# Patient Record
Sex: Female | Born: 1995 | Race: Black or African American | Hispanic: No | Marital: Single | Smoking: Former smoker
Health system: Southern US, Community
[De-identification: ages and names within clinical notes are randomized; demographics above are authoritative.]

## PROBLEM LIST (undated history)

## (undated) ENCOUNTER — Inpatient Hospital Stay (HOSPITAL_COMMUNITY): Payer: Self-pay

## (undated) DIAGNOSIS — D649 Anemia, unspecified: Secondary | ICD-10-CM

## (undated) DIAGNOSIS — N200 Calculus of kidney: Secondary | ICD-10-CM

## (undated) DIAGNOSIS — N390 Urinary tract infection, site not specified: Secondary | ICD-10-CM

## (undated) DIAGNOSIS — F32A Depression, unspecified: Secondary | ICD-10-CM

## (undated) HISTORY — PX: ANKLE SURGERY: SHX546

## (undated) HISTORY — PX: HEAD & NECK SKIN LESION EXCISIONAL BIOPSY: SUR472

---

## 2018-06-22 ENCOUNTER — Encounter (HOSPITAL_COMMUNITY): Payer: Self-pay | Admitting: Emergency Medicine

## 2018-06-22 ENCOUNTER — Emergency Department (HOSPITAL_COMMUNITY): Payer: Medicaid Other

## 2018-06-22 ENCOUNTER — Other Ambulatory Visit: Payer: Self-pay

## 2018-06-22 ENCOUNTER — Emergency Department (HOSPITAL_COMMUNITY)
Admission: EM | Admit: 2018-06-22 | Discharge: 2018-06-22 | Disposition: A | Discharge status: Home / Self Care | Payer: Medicaid Other | Attending: Emergency Medicine | Admitting: Emergency Medicine

## 2018-06-22 DIAGNOSIS — N12 Tubulo-interstitial nephritis, not specified as acute or chronic: Secondary | ICD-10-CM

## 2018-06-22 DIAGNOSIS — R319 Hematuria, unspecified: Secondary | ICD-10-CM | POA: Insufficient documentation

## 2018-06-22 DIAGNOSIS — Z87442 Personal history of urinary calculi: Secondary | ICD-10-CM | POA: Diagnosis not present

## 2018-06-22 DIAGNOSIS — N10 Acute tubulo-interstitial nephritis: Secondary | ICD-10-CM | POA: Diagnosis not present

## 2018-06-22 DIAGNOSIS — R1031 Right lower quadrant pain: Secondary | ICD-10-CM | POA: Diagnosis present

## 2018-06-22 DIAGNOSIS — N1330 Unspecified hydronephrosis: Secondary | ICD-10-CM | POA: Insufficient documentation

## 2018-06-22 LAB — COMPREHENSIVE METABOLIC PANEL WITH GFR
ALT: 41 U/L (ref 0–44)
AST: 32 U/L (ref 15–41)
Albumin: 3.7 g/dL (ref 3.5–5.0)
Alkaline Phosphatase: 84 U/L (ref 38–126)
Anion gap: 10 (ref 5–15)
BUN: 5 mg/dL — ABNORMAL LOW (ref 6–20)
CO2: 22 mmol/L (ref 22–32)
Calcium: 8.9 mg/dL (ref 8.9–10.3)
Chloride: 106 mmol/L (ref 98–111)
Creatinine, Ser: 0.54 mg/dL (ref 0.44–1.00)
GFR calc Af Amer: >60 mL/min
GFR calc non Af Amer: >60 mL/min
Glucose, Bld: 92 mg/dL (ref 70–99)
Potassium: 3.7 mmol/L (ref 3.5–5.1)
Sodium: 138 mmol/L (ref 135–145)
Total Bilirubin: 0.5 mg/dL (ref 0.3–1.2)
Total Protein: 6.9 g/dL (ref 6.5–8.1)

## 2018-06-22 LAB — CBC WITH DIFFERENTIAL/PLATELET
Abs Immature Granulocytes: 0.01 K/uL (ref 0.00–0.07)
Basophils Absolute: 0 K/uL (ref 0.0–0.1)
Basophils Relative: 1 %
Eosinophils Absolute: 0 K/uL (ref 0.0–0.5)
Eosinophils Relative: 0 %
HCT: 36.6 % (ref 36.0–46.0)
Hemoglobin: 11.1 g/dL — ABNORMAL LOW (ref 12.0–15.0)
Immature Granulocytes: 0 %
Lymphocytes Relative: 30 %
Lymphs Abs: 0.9 K/uL (ref 0.7–4.0)
MCH: 26.4 pg (ref 26.0–34.0)
MCHC: 30.3 g/dL (ref 30.0–36.0)
MCV: 87.1 fL (ref 80.0–100.0)
Monocytes Absolute: 0.7 K/uL (ref 0.1–1.0)
Monocytes Relative: 21 %
Neutro Abs: 1.5 K/uL — ABNORMAL LOW (ref 1.7–7.7)
Neutrophils Relative %: 48 %
Platelets: 231 K/uL (ref 150–400)
RBC: 4.2 MIL/uL (ref 3.87–5.11)
RDW: 16.3 % — ABNORMAL HIGH (ref 11.5–15.5)
WBC: 3.2 K/uL — ABNORMAL LOW (ref 4.0–10.5)
nRBC: 0 % (ref 0.0–0.2)

## 2018-06-22 LAB — URINALYSIS, ROUTINE W REFLEX MICROSCOPIC
Bilirubin Urine: NEGATIVE
Glucose, UA: NEGATIVE mg/dL
KETONES UR: NEGATIVE mg/dL
Leukocytes,Ua: NEGATIVE
Nitrite: NEGATIVE
Protein, ur: NEGATIVE mg/dL
Specific Gravity, Urine: 1.025 (ref 1.005–1.030)
pH: 5 (ref 5.0–8.0)

## 2018-06-22 LAB — LIPASE, BLOOD: Lipase: 21 U/L (ref 11–51)

## 2018-06-22 LAB — I-STAT BETA HCG BLOOD, ED (MC, WL, AP ONLY): I-stat hCG, quantitative: <5 m[IU]/mL

## 2018-06-22 MED ORDER — CEPHALEXIN 500 MG PO CAPS: 1000.0000 mg | ORAL_CAPSULE | Freq: Two times a day (BID) | ORAL | 0 refills | Status: DC

## 2018-06-22 MED ORDER — SODIUM CHLORIDE 0.9 % IV SOLN
1.0000 g | Freq: Once | INTRAVENOUS | Status: AC
  Administered 2018-06-22: 1 g via INTRAVENOUS
  Filled 2018-06-22: qty 10

## 2018-06-22 MED ORDER — ONDANSETRON 4 MG PO TBDP: 4.0000 mg | ORAL_TABLET | ORAL | 0 refills | Status: DC | PRN

## 2018-06-22 MED ORDER — SODIUM CHLORIDE 0.9 % IV BOLUS
1000.0000 mL | Freq: Once | INTRAVENOUS | Status: AC
  Administered 2018-06-22: 1000 mL via INTRAVENOUS

## 2018-06-22 MED ORDER — ONDANSETRON HCL 4 MG/2ML IJ SOLN
4.0000 mg | Freq: Once | INTRAMUSCULAR | Status: AC
  Administered 2018-06-22: 4 mg via INTRAVENOUS
  Filled 2018-06-22: qty 2

## 2018-06-22 MED ORDER — KETOROLAC TROMETHAMINE 30 MG/ML IJ SOLN
30.0000 mg | Freq: Once | INTRAMUSCULAR | Status: AC
  Administered 2018-06-22: 30 mg via INTRAVENOUS
  Filled 2018-06-22: qty 1

## 2018-06-22 MED ORDER — MORPHINE SULFATE (PF) 4 MG/ML IV SOLN
4.0000 mg | Freq: Once | INTRAVENOUS | Status: AC
  Administered 2018-06-22: 4 mg via INTRAVENOUS
  Filled 2018-06-22: qty 1

## 2018-06-22 MED ORDER — HYDROCODONE-ACETAMINOPHEN 5-325 MG PO TABS: 1.0000 | ORAL_TABLET | Freq: Four times a day (QID) | ORAL | 0 refills | Status: DC | PRN

## 2018-06-22 NOTE — ED Triage Notes (Signed)
Pt arrives POV with complaints of right side flank pain that began this morning. Pt states she has a history of kidney stones and this pain feels the same. 10/10 pain

## 2018-06-22 NOTE — ED Provider Notes (Signed)
[facility] EMERGENCY DEPARTMENT Provider Note   CSN: [id number] Arrival date & time: 06/22/18  4098    History   Chief Complaint Chief Complaint  Patient presents with   Flank Pain    HPI Gabrielle Jordan is a 23 y.o. female.     HPI Patient reports prior history of kidney stone approximately 3 years ago.  She did not require any interventional procedures at that time.  She reports she did take medications and an antibiotic.  Patient reports she was treated at [facility] at that time.  She denies she is had any recurrence of kidney stone since then.  She has been careful not to drink any soda for the past 3 years.  She reports she has some very mild pain starting yesterday evening.  She had a normal day yesterday without any symptoms.  She reports this morning at 8 AM she awakened and had severe pain in her right flank that radiates towards her vaginal area.  She denies pain or burning with urination.  She denies vaginal discharge or bleeding.  Patient reports she is sexually active.  She does not use birth control.  She does not suspect risk for STD.  Denies problems with pain with intercourse.  Pain is worse with movements.  No fever, no chills, no cough, no shortness of breath. History reviewed. No pertinent past medical history.  There are no active problems to display for this patient.      OB History   No obstetric history on file.      Home Medications    Prior to Admission medications   Medication Sig Start Date End Date Taking? Authorizing Provider  cephALEXin (KEFLEX) 500 MG capsule Take 2 capsules (1,000 mg total) by mouth 2 (two) times daily. 06/22/18   Arby Barrette, MD  HYDROcodone-acetaminophen (NORCO/VICODIN) 5-325 MG tablet Take 1-2 tablets by mouth every 6 (six) hours as needed for moderate pain or severe pain. 06/22/18   Arby Barrette, MD    Family History No family history on file.  Social History Social History   Tobacco  Use   Smoking status: Not on file  Substance Use Topics   Alcohol use: Yes   Drug use: Never     Allergies   Patient has no allergy information on record.   Review of Systems Review of Systems 10 Systems reviewed and are negative for acute change except as noted in the HPI.   Physical Exam Updated Vital Signs BP (!) 97/54    Pulse 68    Temp 98.6 F (37 C) (Oral)    Resp 13    Ht  (1.422 m)    Wt 72.6 kg    SpO2 99%    BMI 35.87 kg/m   Physical Exam Constitutional:      Appearance: She is well-developed.  HENT:     Head: Normocephalic and atraumatic.  Eyes:     Extraocular Movements: Extraocular movements intact.  Neck:     Musculoskeletal: Neck supple.  Cardiovascular:     Rate and Rhythm: Normal rate and regular rhythm.     Heart sounds: Normal heart sounds.  Pulmonary:     Effort: Pulmonary effort is normal.     Breath sounds: Normal breath sounds.  Abdominal:     General: Bowel sounds are normal. There is no distension.     Palpations: Abdomen is soft.     Tenderness: There is abdominal tenderness.     Comments: Patient dorsal  tenderness palpation in the suprapubic area as well as right lower quadrant.  Positive tenderness to percussion right flank.  Musculoskeletal: Normal range of motion.  Skin:    General: Skin is warm and dry.  Neurological:     Mental Status: She is alert and oriented to person, place, and time.     GCS: GCS eye subscore is 4. GCS verbal subscore is 5. GCS motor subscore is 6.     Coordination: Coordination normal.  Psychiatric:        Mood and Affect: Mood normal.      ED Treatments / Results  Labs (all labs ordered are listed, but only abnormal results are displayed) Labs Reviewed  URINALYSIS, ROUTINE W REFLEX MICROSCOPIC - Abnormal; Notable for the following components:      Result Value   APPearance HAZY (*)    Hgb urine dipstick LARGE (*)    RBC / HPF >50 (*)    Bacteria, UA RARE (*)    All other components  within normal limits  COMPREHENSIVE METABOLIC PANEL - Abnormal; Notable for the following components:   BUN 5 (*)    All other components within normal limits  CBC WITH DIFFERENTIAL/PLATELET - Abnormal; Notable for the following components:   WBC 3.2 (*)    Hemoglobin 11.1 (*)    RDW 16.3 (*)    Neutro Abs 1.5 (*)    All other components within normal limits  URINE CULTURE  LIPASE, BLOOD  I-STAT BETA HCG BLOOD, ED ([facility], [facility], [facility] ONLY)    EKG None  Radiology Ct Renal Stone Study  Result Date: 06/22/2018 CLINICAL DATA:  Right-sided flank pain EXAM: CT ABDOMEN AND PELVIS WITHOUT CONTRAST TECHNIQUE: Multidetector CT imaging of the abdomen and pelvis was performed following the standard protocol without oral or IV contrast. COMPARISON:  None. FINDINGS: Lower chest: Lung bases are clear. Hepatobiliary: No focal liver lesions are appreciable on this noncontrast enhanced study. Gallbladder wall is not appreciably thickened. There is no biliary duct dilatation. Pancreas: There is no pancreatic mass or inflammatory focus. Spleen: No splenic lesions are evident. Adrenals/Urinary Tract: Adrenals bilaterally appear normal. There is no appreciable renal mass on either side. There is mild hydronephrosis on the right. There is no appreciable hydronephrosis on the left. There is no intrarenal calculus on either side. There is no appreciable ureteral calculus on either side. Urinary bladder is midline with wall thickness within normal limits. Stomach/Bowel: There is no appreciable bowel wall or mesenteric thickening. There is no evident bowel obstruction. There is no free air or portal venous air. Vascular/Lymphatic: There is no abdominal aortic aneurysm. No vascular lesions are appreciable on this noncontrast enhanced study. There is no adenopathy in the abdomen or pelvis. Reproductive: Uterus is anteverted. There is a mildly complex left adnexal mass containing calcification measuring 2.2 by 2.1 x 1.7 cm.  Questions small ovarian dermoid on the left. No other evident pelvic mass. Other: Appendix appears normal. No abscess or ascites is evident in the abdomen or pelvis. There is a small ventral hernia containing only fat. Musculoskeletal: There are no blastic or lytic bone lesions. No intramuscular lesions are evident. IMPRESSION: 1. Slight hydronephrosis on the right. No evident renal or ureteral calculus on the right. Question recent calculus passage on the right. Pyelonephritis could present in this manner and is a differential consideration. Note that there is no perinephric fluid or abscess appreciable involving the right kidney on this noncontrast enhanced study. 2. 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 Pelvic ultrasound  could be helpful to further evaluate in this regard. 3. No evident bowel obstruction. No abscess in the abdomen or pelvis. Appendix appears normal. 4.  Small ventral hernia containing only fat. Electronically Signed   By: Bretta Bang III M.D.   On: 06/22/2018 11:43    Procedures Procedures (including critical care time)  Medications Ordered in ED Medications  cefTRIAXone (ROCEPHIN) 1 g in sodium chloride 0.9 % 100 mL IVPB (1 g Intravenous New Bag/Given 06/22/18 1303)  sodium chloride 0.9 % bolus 1,000 mL (0 mLs Intravenous Stopped 06/22/18 1139)  morphine 4 MG/ML injection 4 mg (4 mg Intravenous Given 06/22/18 1024)  ondansetron (ZOFRAN) injection 4 mg (4 mg Intravenous Given 06/22/18 1023)  ketorolac (TORADOL) 30 MG/ML injection 30 mg (30 mg Intravenous Given 06/22/18 1258)     Initial Impression / Assessment and Plan / ED Course  I have reviewed the triage vital signs and the nursing notes.  Pertinent labs & imaging results that were available during my care of the patient were reviewed by me and considered in my medical decision making (see chart for details).       Patient presents with right flank pain radiating to her groin region.  Reports prior history of kidney  stone.  Urinalysis is grossly positive for blood but not white cells.  CT does not show a stone but mild hydro-.  Consideration is for possibly recently passed kidney stone.  Patient however was quite tender to CVA percussion and suprapubic palpation.  Given these physical exam findings, I also have concern for possible pyelonephritis.  Patient has been given a dose of Rocephin in the emergency department and will be prescribed Keflex.  Urine culture obtained.  Patient is counseled on necessity of follow-up on outpatient basis for recheck of resolution of symptoms and findings.  Return precautions reviewed.  Final Clinical Impressions(s) / ED Diagnoses   Final diagnoses:  Hematuria, unspecified type  Hydronephrosis, unspecified hydronephrosis type  Pyelonephritis    ED Discharge Orders         Ordered    cephALEXin (KEFLEX) 500 MG capsule  2 times daily     06/22/18 1318    HYDROcodone-acetaminophen (NORCO/VICODIN) 5-325 MG tablet  Every 6 hours PRN     06/22/18 1318           Arby Barrette, MD 06/22/18 1325

## 2018-06-22 NOTE — Discharge Instructions (Signed)
1.  You presented with symptoms that were suggestive of a kidney stone.  CT scan shows some swelling of the kidney but no stone.  It is possible that you passed it prior to coming to the emergency department.  Other possibility, is that you have a kidney infection without any stones.  A culture of your urine has been obtained.  This will help determine if there is any infection present.  The results will take 1 to 3 days. 2.  Take antibiotics as prescribed.  You have been given a dose of IV antibiotic in the emergency department that is good for 24 hours.  You can start your antibiotics tomorrow around noon. 3.  Try to rest and drink plenty of fluids.  You may take 1-2 Vicodin tablets every 6 hours if needed for pain control. 4.  You should be seen by a family doctor for recheck.  You will need some repeat lab work and a urinalysis to make sure that they have all gone back to normal.  If there are any remaining concerns or failure to completely resolve, you may need referral to a specialist called a urologist. 5.  If you start to get more sick, your pain is not controlled, you develop vomiting fever or other concerning symptoms return to the emergency department.

## 2018-06-24 LAB — URINE CULTURE: Culture: 40000 — AB

## 2018-06-25 ENCOUNTER — Telehealth: Payer: Self-pay | Admitting: *Deleted

## 2018-06-25 NOTE — Progress Notes (Signed)
ED Antimicrobial Stewardship Positive Culture Follow Up   Malila Palafox is an 23 y.o. female who presented to [facility] on 06/22/2018 with a chief complaint of  Chief Complaint  Patient presents with  . Flank Pain    Recent Results (from the past 720 hour(s))  Urine culture     Status: Abnormal   Collection Time: 06/22/18 10:14 AM  Result Value Ref Range Status   Specimen Description URINE, RANDOM  Final   Special Requests   Final    NONE Performed at [facility], [address] [place], [place] [address]    Culture 40,000 COLONIES/mL ENTEROCOCCUS FAECALIS (A)  Final   Report Status 06/24/2018 FINAL  Final   Organism ID, Bacteria ENTEROCOCCUS FAECALIS (A)  Final      Susceptibility   Enterococcus faecalis - MIC*    AMPICILLIN <=2 SENSITIVE Sensitive     LEVOFLOXACIN 1 SENSITIVE Sensitive     NITROFURANTOIN <=16 SENSITIVE Sensitive     VANCOMYCIN 1 SENSITIVE Sensitive     * 40,000 COLONIES/mL ENTEROCOCCUS FAECALIS    [x]  Treated with cephalexin, organism resistant to prescribed antimicrobial []  Patient discharged originally without antimicrobial agent and treatment is now indicated  New antibiotic prescription: DC cephalexin, start amoxicillin 500mg  PO TID x 14 days  ED Provider: Burna Forts, PA   Leyland Kenna, Drake Leach 06/25/2018, 7:49 AM Clinical Pharmacist Monday - Friday phone -  [phone] Saturday - Sunday phone - [phone]

## 2018-06-25 NOTE — Telephone Encounter (Signed)
Post ED Visit - Positive Culture Follow-up: Successful Patient Follow-Up  Culture assessed and recommendations reviewed by:  []  Enzo Bi, Pharm.D. []  Celedonio Miyamoto, Pharm.D., BCPS AQ-ID []  Garvin Fila, Pharm.D., BCPS []  Georgina Pillion, Pharm.D., BCPS []  [place], [address].D., BCPS, AAHIVP []  Estella Husk, Pharm.D., BCPS, AAHIVP [x]  Lysle Pearl, PharmD, BCPS []  Phillips Climes, PharmD, BCPS []  Agapito Games, PharmD, BCPS []  Verlan Friends, PharmD  Positive urine culture  []  Patient discharged without antimicrobial prescription and treatment is now indicated [x]  Organism is resistant to prescribed ED discharge antimicrobial []  Patient with positive blood cultures  Changes discussed with ED provider, Burna Forts, PA New antibiotic prescription Amoxicillin 500mg  PO TID x 14 days Called to [place], [facility] [phone]  Contacted patient, date 06/25/2018, time 0915   Lysle Pearl 06/25/2018, 9:10 AM

## 2018-09-17 ENCOUNTER — Other Ambulatory Visit: Payer: Self-pay

## 2018-09-17 ENCOUNTER — Encounter (HOSPITAL_COMMUNITY): Payer: Self-pay

## 2018-09-17 ENCOUNTER — Emergency Department (HOSPITAL_COMMUNITY)
Admission: EM | Admit: 2018-09-17 | Discharge: 2018-09-17 | Disposition: A | Discharge status: Home / Self Care | Payer: Medicaid Other | Attending: Emergency Medicine | Admitting: Emergency Medicine

## 2018-09-17 DIAGNOSIS — Z5321 Procedure and treatment not carried out due to patient leaving prior to being seen by health care provider: Secondary | ICD-10-CM | POA: Diagnosis not present

## 2018-09-17 DIAGNOSIS — R109 Unspecified abdominal pain: Secondary | ICD-10-CM | POA: Insufficient documentation

## 2018-09-17 LAB — URINALYSIS, ROUTINE W REFLEX MICROSCOPIC
Bilirubin Urine: NEGATIVE
Glucose, UA: NEGATIVE mg/dL
Ketones, ur: NEGATIVE mg/dL
Nitrite: NEGATIVE
Protein, ur: 30 mg/dL — AB
RBC / HPF: >50 RBC/hpf — ABNORMAL HIGH (ref 0–5)
Specific Gravity, Urine: 1.034 — ABNORMAL HIGH (ref 1.005–1.030)
pH: 5 (ref 5.0–8.0)

## 2018-09-17 LAB — CBC
HCT: 40.4 % (ref 36.0–46.0)
Hemoglobin: 12.4 g/dL (ref 12.0–15.0)
MCH: 27.3 pg (ref 26.0–34.0)
MCHC: 30.7 g/dL (ref 30.0–36.0)
MCV: 89 fL (ref 80.0–100.0)
Platelets: 182 10*3/uL (ref 150–400)
RBC: 4.54 MIL/uL (ref 3.87–5.11)
RDW: 15.3 % (ref 11.5–15.5)
WBC: 3.8 10*3/uL — ABNORMAL LOW (ref 4.0–10.5)
nRBC: 0 % (ref 0.0–0.2)

## 2018-09-17 LAB — BASIC METABOLIC PANEL
Anion gap: 8 (ref 5–15)
BUN: 11 mg/dL (ref 6–20)
CO2: 21 mmol/L — ABNORMAL LOW (ref 22–32)
Calcium: 8.7 mg/dL — ABNORMAL LOW (ref 8.9–10.3)
Chloride: 109 mmol/L (ref 98–111)
Creatinine, Ser: 0.63 mg/dL (ref 0.44–1.00)
GFR calc Af Amer: >60 mL/min (ref >60)
GFR calc non Af Amer: >60 mL/min (ref >60)
Glucose, Bld: 94 mg/dL (ref 70–99)
Potassium: 4 mmol/L (ref 3.5–5.1)
Sodium: 138 mmol/L (ref 135–145)

## 2018-09-17 LAB — I-STAT BETA HCG BLOOD, ED (MC, WL, AP ONLY): I-stat hCG, quantitative: <5 m[IU]/mL (ref <5)

## 2018-09-17 NOTE — ED Triage Notes (Signed)
Pt c/o right flank pain x2-3 days ago. Also c/o blood in urine and a burning sensation. Hx of kidney stones.

## 2018-09-17 NOTE — ED Notes (Signed)
Called x3 for room, no answer. 

## 2018-09-17 NOTE — ED Notes (Signed)
Pt's name was called 3 times over a 15 minute time period with no response. Pt did not tell any staff she was leaving.

## 2018-09-26 ENCOUNTER — Encounter (HOSPITAL_COMMUNITY): Payer: Self-pay | Admitting: Emergency Medicine

## 2018-09-26 ENCOUNTER — Emergency Department (HOSPITAL_COMMUNITY)
Admission: EM | Admit: 2018-09-26 | Discharge: 2018-09-26 | Disposition: A | Discharge status: Home / Self Care | Payer: Medicaid Other | Attending: Emergency Medicine | Admitting: Emergency Medicine

## 2018-09-26 ENCOUNTER — Emergency Department (HOSPITAL_COMMUNITY): Payer: Medicaid Other

## 2018-09-26 ENCOUNTER — Other Ambulatory Visit: Payer: Self-pay

## 2018-09-26 DIAGNOSIS — M79605 Pain in left leg: Secondary | ICD-10-CM | POA: Diagnosis not present

## 2018-09-26 DIAGNOSIS — M25572 Pain in left ankle and joints of left foot: Secondary | ICD-10-CM | POA: Diagnosis present

## 2018-09-26 MED ORDER — IBUPROFEN 400 MG PO TABS
600.0000 mg | ORAL_TABLET | Freq: Once | ORAL | Status: AC
  Administered 2018-09-26: 600 mg via ORAL
  Filled 2018-09-26: qty 1

## 2018-09-26 NOTE — ED Provider Notes (Signed)
[facility] EMERGENCY DEPARTMENT Provider Note   CSN: [id number] Arrival date & time: 09/26/18  1806    History   Chief Complaint Chief Complaint  Patient presents with  . Ankle Pain    HPI Gabrielle Jordan is a 23 y.o. female.     HPI   Gabrielle Jordan is a 23 y.o. female, with a history of tib-fib fracture with surgical repair, presenting to the ED with left ankle pain for last 3-4 days.  States her left ankle began aching like it typically does when it rains outside, however, over the last couple days her pain really increased.  It is across the left ankle, radiating proximally into the tibia and into the left foot.  Had distal tib/fib fracture several years ago with surgical repair.  She does not remember the surgeon, however, states it was performed at [facility]. Has been exercising and running more. Has tried epsom salt bath without improvement.  LMP June 24.  Chart states she is pregnant, however, patient delivered December 2019.  She is not breast-feeding. Denies recent injury, fever/chills, numbness, weakness, or any other complaints.     History reviewed. No pertinent past medical history.  There are no active problems to display for this patient.   History reviewed. No pertinent surgical history.   OB History    Gravida  1   Para      Term      Preterm      AB      Living        SAB      TAB      Ectopic      Multiple      Live Births               Home Medications    Prior to Admission medications   Medication Sig Start Date End Date Taking? Authorizing Provider  cephALEXin (KEFLEX) 500 MG capsule Take 2 capsules (1,000 mg total) by mouth 2 (two) times daily. 06/22/18   Charlesetta Shanks, MD  HYDROcodone-acetaminophen (NORCO/VICODIN) 5-325 MG tablet Take 1-2 tablets by mouth every 6 (six) hours as needed for moderate pain or severe pain. 06/22/18   Charlesetta Shanks, MD  ondansetron (ZOFRAN ODT) 4 MG disintegrating  tablet Take 1 tablet (4 mg total) by mouth every 4 (four) hours as needed for nausea or vomiting. 06/22/18   Charlesetta Shanks, MD    Family History No family history on file.  Social History Social History   Tobacco Use  . Smoking status: Not on file  Substance Use Topics  . Alcohol use: Yes  . Drug use: Never     Allergies   Patient has no known allergies.   Review of Systems Review of Systems  Constitutional: Negative for chills and fever.  Musculoskeletal: Positive for arthralgias.  Neurological: Negative for weakness and numbness.     Physical Exam Updated Vital Signs BP 125/75 (BP Location: Right Arm)   Pulse (!) 102   Temp 98.3 F (36.8 C) (Oral)   Resp 20   LMP 08/21/2018 (Approximate)   SpO2 100%   Physical Exam Vitals signs and nursing note reviewed.  Constitutional:      General: She is not in acute distress.    Appearance: She is well-developed. She is not diaphoretic.  HENT:     Head: Normocephalic and atraumatic.  Eyes:     Conjunctiva/sclera: Conjunctivae normal.  Neck:     Musculoskeletal: Neck supple.  Cardiovascular:  Rate and Rhythm: Normal rate and regular rhythm.     Pulses:          Dorsalis pedis pulses are 2+ on the left side.       Posterior tibial pulses are 2+ on the left side.  Pulmonary:     Effort: Pulmonary effort is normal.  Musculoskeletal:     Comments: Tenderness across the anterior left ankle as well as the lateral and medial malleoli.   Tenderness extends along the tibia toward the knee as well as distally into the dorsal foot. No noted swelling, color change, deformity, or instability. Full range of motion without noted difficulty in the left knee.  No noted laxity.  She does not have pain, tenderness, swelling, increased warmth, or erythema in the calf.  Skin:    General: Skin is warm and dry.     Capillary Refill: Capillary refill takes less than 2 seconds.     Coloration: Skin is not pale.  Neurological:      Mental Status: She is alert.     Comments: Sensation to light touch grossly intact in the left lower extremity and toes. Motor function intact.  Strength 5/5 in the ankle and knee.  Psychiatric:        Behavior: Behavior normal.      ED Treatments / Results  Labs (all labs ordered are listed, but only abnormal results are displayed) Labs Reviewed - No data to display  EKG None  Radiology Dg Tibia/fibula Left  Result Date: 09/26/2018 CLINICAL DATA:  Initial evaluation for increased pain, no known injury. EXAM: LEFT TIBIA AND FIBULA - 2 VIEW COMPARISON:  None. FINDINGS: No acute fracture or dislocation. Sequelae of prior ORIF at the left ankle. No visible hardware complication. Mild soft tissue swelling seen at the anterior aspect of the ankle. No other acute soft tissue abnormality. Limited views of the knee are unremarkable. IMPRESSION: 1. No acute osseous abnormality about the left tibia/fibula. 2. Sequelae of prior ORIF at the left ankle. No visible hardware complication. Electronically Signed   By: Rise MuBenjamin  McClintock M.D.   On: 09/26/2018 20:15   Dg Ankle Complete Left  Result Date: 09/26/2018 CLINICAL DATA:  Initial evaluation for worsening pain, no known injury. History of prior surgery. EXAM: LEFT ANKLE COMPLETE - 3+ VIEW COMPARISON:  None. FINDINGS: Sequelae of prior ORIF seen at the distal left fibula and tibia. No periprosthetic lucency to suggest failure. No acute fracture dislocation. Ankle mortise approximated. Mild soft tissue swelling at the anterior aspect of the ankle, seen on lateral view. IMPRESSION: 1. No acute osseous abnormality about the left ankle. 2. Sequelae of prior ORIF without hardware complication. 3. Mild soft tissue swelling at the anterior aspect of the ankle. Electronically Signed   By: Rise MuBenjamin  McClintock M.D.   On: 09/26/2018 20:12   Dg Foot Complete Left  Result Date: 09/26/2018 CLINICAL DATA:  Initial evaluation for worsening pain and tenderness,  history of previous surgery. EXAM: LEFT FOOT - COMPLETE 3+ VIEW COMPARISON:  None. FINDINGS: No acute fracture or dislocation. Joint spaces maintained without evidence for degenerative or erosive arthropathy. Sequelae of prior ORIF seen about the left ankle. Mild overlying soft tissue swelling at the ankle. No other soft tissue abnormality. IMPRESSION: 1. No acute osseous abnormality about the left foot. Sequelae of prior ORIF at the left ankle with overlying mild soft tissue swelling. Electronically Signed   By: Rise MuBenjamin  McClintock M.D.   On: 09/26/2018 20:09    Procedures Procedures (including  critical care time)  Medications Ordered in ED Medications  ibuprofen (ADVIL) tablet 600 mg (600 mg Oral Given 09/26/18 2007)     Initial Impression / Assessment and Plan / ED Course  I have reviewed the triage vital signs and the nursing notes.  Pertinent labs & imaging results that were available during my care of the patient were reviewed by me and considered in my medical decision making (see chart for details).        Patient presents with several days of left lower extremity pain.  She has bony tenderness on exam, without other abnormalities noted.  No acute abnormalities noted on x-rays today.  She will follow-up with orthopedics on this matter. The patient was given instructions for home care as well as return precautions. Patient voices understanding of these instructions, accepts the plan, and is comfortable with discharge.   Final Clinical Impressions(s) / ED Diagnoses   Final diagnoses:  Left leg pain    ED Discharge Orders    None       Concepcion LivingJoy, Maika Mcelveen C, PA-C 09/26/18 2103    Alvira MondaySchlossman, Erin, MD 09/28/18 1524

## 2018-09-26 NOTE — ED Notes (Signed)
Ortho paged and returned call.  En route to ED.

## 2018-09-26 NOTE — ED Triage Notes (Signed)
Pt arrives with swelling and pain to right ankle- pt states she had an extensive injury to this ankle 3-4 years ago with surgery. Pt denies any known new injury just noticed new swelling.

## 2018-09-26 NOTE — Progress Notes (Signed)
Orthopedic Tech Progress Note Patient Details:  Nastashia Gallo October 28, 1995 [id number]  Ortho Devices Type of Ortho Device: Ankle Air splint, Crutches Ortho Device/Splint Location: lle. ankle air cast applied after cam walker wouldnt fit properly with drs permission. Ortho Device/Splint Interventions: Ordered, Application, Adjustment   Post Interventions Patient Tolerated: Well Instructions Provided: Care of device, Adjustment of device   Karolee Stamps 09/26/2018, 9:27 PM

## 2018-09-26 NOTE — Discharge Instructions (Addendum)
You have been seen today for left lower leg pain. There were no acute abnormalities on the x-rays, including no sign of fracture or dislocation, however, there could be injuries to the soft tissues, such as the ligaments or tendons that are not seen on xrays. There could also be what are called occult fractures that are small fractures not seen on xray. Antiinflammatory medications: Take 600 mg of ibuprofen every 6 hours or 440 mg (over the counter dose) to 500 mg (prescription dose) of naproxen every 12 hours for the next 3 days. After this time, these medications may be used as needed for pain. Take these medications with food to avoid upset stomach. Choose only one of these medications, do not take them together. Acetaminophen (generic for Tylenol): Should you continue to have additional pain while taking the ibuprofen or naproxen, you may add in acetaminophen as needed. Your daily total maximum amount of acetaminophen from all sources should be limited to 4000mg /day for persons without liver problems, or 2000mg /day for those with liver problems. Ice: May apply ice to the area over the next 24 hours for 15 minutes at a time to reduce swelling. Elevation: Keep the extremity elevated as often as possible to reduce pain and inflammation. Support: Wear the cam walker for support and comfort. Wear this until pain resolves. You will be weight-bearing as tolerated, which means you can slowly start to put weight on the extremity and increase amount and frequency as pain allows. Follow up: Follow-up with the orthopedic office, ideally within the next week or two. Return: Return to the ED for numbness, weakness, increasing pain, overall worsening symptoms, loss of function, or if symptoms are not improving, you have tried to follow up with the orthopedic specialist, and have been unable to do so.  For prescription assistance, may try using prescription discount sites or apps, such as goodrx.com

## 2018-09-29 ENCOUNTER — Other Ambulatory Visit: Payer: Self-pay

## 2018-09-29 ENCOUNTER — Emergency Department (HOSPITAL_BASED_OUTPATIENT_CLINIC_OR_DEPARTMENT_OTHER): Payer: Medicaid Other

## 2018-09-29 ENCOUNTER — Emergency Department (HOSPITAL_COMMUNITY)
Admission: EM | Admit: 2018-09-29 | Discharge: 2018-09-29 | Disposition: A | Discharge status: Home / Self Care | Payer: Medicaid Other | Attending: Emergency Medicine | Admitting: Emergency Medicine

## 2018-09-29 DIAGNOSIS — M79662 Pain in left lower leg: Secondary | ICD-10-CM | POA: Diagnosis not present

## 2018-09-29 DIAGNOSIS — M79609 Pain in unspecified limb: Secondary | ICD-10-CM | POA: Diagnosis not present

## 2018-09-29 DIAGNOSIS — R600 Localized edema: Secondary | ICD-10-CM | POA: Insufficient documentation

## 2018-09-29 DIAGNOSIS — M25572 Pain in left ankle and joints of left foot: Secondary | ICD-10-CM | POA: Diagnosis present

## 2018-09-29 DIAGNOSIS — Z79899 Other long term (current) drug therapy: Secondary | ICD-10-CM | POA: Insufficient documentation

## 2018-09-29 MED ORDER — HYDROCODONE-ACETAMINOPHEN 5-325 MG PO TABS
1.0000 | ORAL_TABLET | Freq: Once | ORAL | Status: AC
  Administered 2018-09-29: 1 via ORAL
  Filled 2018-09-29: qty 1

## 2018-09-29 NOTE — Discharge Instructions (Addendum)
Your workup today shows no evidence of blood clots in the leg.   You can take Tylenol or Ibuprofen as directed for pain. You can alternate Tylenol and Ibuprofen every 4 hours. If you take Tylenol at 1pm, then you can take Ibuprofen at 5pm. Then you can take Tylenol again at 9pm, etc.   Follow the RICE (Rest, Ice, Compression, Elevation) protocol as directed.   Continue wearing the cam walker boot.  Discuss with your orthopedic doctor regarding appropriate follow-up.  Return the emergency department for any worsening pain, redness or swelling, fevers, numbness/weakness or any other worsening or concerning symptoms.

## 2018-09-29 NOTE — Progress Notes (Signed)
LLE venous duplex       has been completed. Preliminary results can be found under CV proc through chart review. Dorsey Authement, BS, RDMS, RVT    

## 2018-09-29 NOTE — ED Provider Notes (Signed)
[facility] Provider Note   CSN: [id number] Arrival date & time: 09/29/18  [id number]    History   Chief Complaint Chief Complaint  Patient presents with   Ankle Pain    HPI Gabrielle Jordan is a 23 y.o. female presents for evaluation of continued and persistent left lower extremity pain.  Patient seen here on 09/26/18 for evaluation of leg pain.  She reports a history of tib-fib fracture several years ago that had surgical repair.  She reports that she had started experiencing some mild pain.  No new trauma, injury, fall.  She had imaging at that time that was unremarkable.  She was instructed to follow-up with outpatient orthopedics which she saw on 09/27/18.  At orthopedics office, she was told to wear a cam walker boot and to come back to their office in about 3 weeks.  She comes in the emergency department today because she states she is continued to have worsening pain.  She reports pain to the left foot and into the ankle.  She states that this pain radiates up her left lower extremity.  Additionally, she reports that yesterday, she started experiencing swelling of the foot, ankle and lower leg.  She has not noticed any overlying warmth or erythema.  She denies any fevers.  She has been trying to elevated at home and she has been compliant with the cam walker boot.  Patient denies any numbness/weakness.  She denies any OCP use, recent immobilization, prior history of DVT/PE, recent surgery, leg swelling, or long travel.     The history is provided by the patient.    No past medical history on file.  There are no active problems to display for this patient.   No past surgical history on file.   OB History    Gravida  1   Para      Term      Preterm      AB      Living        SAB      TAB      Ectopic      Multiple      Live Births               Home Medications    Prior to Admission medications   Medication Sig Start Date  End Date Taking? Authorizing Provider  cephALEXin (KEFLEX) 500 MG capsule Take 2 capsules (1,000 mg total) by mouth 2 (two) times daily. 06/22/18   Arby BarrettePfeiffer, Marcy, MD  HYDROcodone-acetaminophen (NORCO/VICODIN) 5-325 MG tablet Take 1-2 tablets by mouth every 6 (six) hours as needed for moderate pain or severe pain. 06/22/18   Arby BarrettePfeiffer, Marcy, MD  ondansetron (ZOFRAN ODT) 4 MG disintegrating tablet Take 1 tablet (4 mg total) by mouth every 4 (four) hours as needed for nausea or vomiting. 06/22/18   Arby BarrettePfeiffer, Marcy, MD    Family History No family history on file.  Social History Social History   Tobacco Use   Smoking status: Not on file  Substance Use Topics   Alcohol use: Yes   Drug use: Never     Allergies   Patient has no known allergies.   Review of Systems Review of Systems  Cardiovascular: Positive for leg swelling.  Musculoskeletal:       Ankle pain  Neurological: Negative for weakness and numbness.  All other systems reviewed and are negative.    Physical Exam Updated Vital Signs BP 111/74 (BP Location: Right  Arm)    Pulse 72    Temp 98 F (36.7 C) (Oral)    Resp 16    Ht 4\' 8"  (1.422 m)    Wt 73 kg    LMP 09/22/2018    SpO2 99%    BMI 36.10 kg/m   Physical Exam Vitals signs and nursing note reviewed.  Constitutional:      Appearance: She is well-developed.  HENT:     Head: Normocephalic and atraumatic.  Eyes:     General: No scleral icterus.       Right eye: No discharge.        Left eye: No discharge.     Conjunctiva/sclera: Conjunctivae normal.  Cardiovascular:     Pulses:          Dorsalis pedis pulses are 2+ on the right side and 2+ on the left side.  Pulmonary:     Effort: Pulmonary effort is normal.  Musculoskeletal:     Comments: Nonpitting edema noted to left lower extremity that extends from the dorsal aspect of the foot just above the distal tib-fib area.  No overlying warmth, erythema.  Mild tenderness palpation noted to left left calf.   Tenderness palpation noted to lateral aspect of the left ankle.  No deformity or crepitus noted.  Dorsiflexion and plantar flexion intact but with subjective reports of pain.  She can wiggle all 5 toes.   Skin:    General: Skin is warm and dry.     Capillary Refill: Capillary refill takes less than 2 seconds.     Comments: Good distal cap refill. LLE is not dusky in appearance or cool to touch.  Neurological:     Mental Status: She is alert.     Comments: Sensation intact along major nerve distributions of BLE  Psychiatric:        Speech: Speech normal.        Behavior: Behavior normal.      ED Treatments / Results  Labs (all labs ordered are listed, but only abnormal results are displayed) Labs Reviewed - No data to display  EKG None  Radiology No results found.  Procedures Procedures (including critical care time)  Medications Ordered in ED Medications  HYDROcodone-acetaminophen (NORCO/VICODIN) 5-325 MG per tablet 1 tablet (1 tablet Oral Given 09/29/18 0641)     Initial Impression / Assessment and Plan / ED Course  I have reviewed the triage vital signs and the nursing notes.  Pertinent labs & imaging results that were available during my care of the patient were reviewed by me and considered in my medical decision making (see chart for details).        23 year old female who presents for evaluation of left lower extremity pain and swelling.  Seen here on 09/26/18 for evaluation of pain.  Had x-rays at done at that time.  He has a history of a tib-fib fracture about 3 years ago.  She has not followed up with Ortho since then.  She was directed to follow-up with Ortho in [place] which she saw on 09/27/18.  They again repeated the x-ray which showed no acute abnormalities.  He states that they told her she may be having irritation from the screws.  Patient states that she was given a cam walker boot and told to walk in a cam walker boot for the next 3 weeks and  follow-up.  She reports last night, she started noticing some swelling to the left lower extremity and states that since then,  she has had worsening pain and swelling to left lower extremity.  No overlying warmth, erythema. Patient is afebrile, non-toxic appearing, sitting comfortably on examination table. Vital signs reviewed and stable.  On exam, left lower extremity is slightly larger than the right lower extremity.  No overlying warmth, erythema.  She denies any new trauma, injury.  At this time, I do not feel she needs repeat x-rays for evaluation of any acute bony abnormality.  She does have asymmetric swelling which she states is new since last night.  Given asymmetry noted on exam, will plan for duplex today for evaluation of DVT.  [facility] PA-C with US venous pending.  If negative, patient be discharged home.  She has a cam walker boot.  Instructed her that she will need to follow-up with her orthopedic doctor.  Patient has only been taking ibuprofen.  Discussed with patient that she could take Tylenol and ibuprofen.  Portions of this note were generated with Scientist, clinical (histocompatibility and immunogenetics)Dragon dictation software. Dictation errors may occur despite best attempts at proofreading.   Final Clinical Impressions(s) / ED Diagnoses   Final diagnoses:  Acute left ankle pain    ED Discharge Orders    None       Rosana HoesLayden, Veanna Dower A, PA-C 09/29/18 0820    Dione BoozeGlick, David, MD 09/29/18 2236

## 2018-09-29 NOTE — ED Triage Notes (Signed)
Pt c/o left ankle pain. Seen here three days ago for same. Pt states that the swelling is still there. Saw an orthopedist and was given a walking boot. Was advised by orthopedist to wear walking boot for three weeks and follow up with them.

## 2018-09-29 NOTE — ED Provider Notes (Signed)
Received patient at signout from [facility].  Refer to provider note for full history and physical examination.  Briefly, patient is a 23 year old female presenting for evaluation of persistent left lower extremity pain beginning on 09/26/2018.  Since then has been seen by the ED and orthopedics with reassuring work-up.  She presents today with swelling of the foot that she noted yesterday.  Pending DVT study.  If negative, she can be discharged home with follow-up with orthopedics on an outpatient basis.  MDM  DVT study negative.  Patient instructed to alternate ibuprofen and Tylenol as needed for pain and swelling.  Also discussed conservative measures such as rest, ice, compression, and elevation.  Strict ED return precautions discussed.  Patient verbalized understanding of and agreement with plan and patient stable for discharge at this time.       Renita Papa, PA-C 09/29/18 0831    Tegeler, Gwenyth Allegra, MD 09/29/18 [phone]

## 2018-10-03 ENCOUNTER — Encounter (HOSPITAL_COMMUNITY): Payer: Self-pay | Admitting: *Deleted

## 2018-10-03 ENCOUNTER — Emergency Department (HOSPITAL_COMMUNITY)
Admission: EM | Admit: 2018-10-03 | Discharge: 2018-10-03 | Disposition: A | Discharge status: Home / Self Care | Payer: Medicaid Other | Attending: Emergency Medicine | Admitting: Emergency Medicine

## 2018-10-03 ENCOUNTER — Other Ambulatory Visit: Payer: Self-pay

## 2018-10-03 DIAGNOSIS — K0889 Other specified disorders of teeth and supporting structures: Secondary | ICD-10-CM | POA: Insufficient documentation

## 2018-10-03 DIAGNOSIS — Z87891 Personal history of nicotine dependence: Secondary | ICD-10-CM | POA: Insufficient documentation

## 2018-10-03 DIAGNOSIS — Z79899 Other long term (current) drug therapy: Secondary | ICD-10-CM | POA: Insufficient documentation

## 2018-10-03 MED ORDER — NAPROXEN 250 MG PO TABS
500.0000 mg | ORAL_TABLET | Freq: Once | ORAL | Status: AC
  Administered 2018-10-03: 500 mg via ORAL
  Filled 2018-10-03: qty 2

## 2018-10-03 MED ORDER — AMOXICILLIN 500 MG PO CAPS
500.0000 mg | ORAL_CAPSULE | Freq: Once | ORAL | Status: AC
  Administered 2018-10-03: 500 mg via ORAL
  Filled 2018-10-03: qty 1

## 2018-10-03 MED ORDER — NAPROXEN 500 MG PO TABS: 500.0000 mg | ORAL_TABLET | Freq: Two times a day (BID) | ORAL | 0 refills | Status: DC | PRN

## 2018-10-03 MED ORDER — AMOXICILLIN 500 MG PO CAPS: 500.0000 mg | ORAL_CAPSULE | Freq: Three times a day (TID) | ORAL | 0 refills | Status: DC

## 2018-10-03 NOTE — ED Triage Notes (Signed)
C/o left lower jaw pain onset yest

## 2018-10-03 NOTE — ED Provider Notes (Signed)
[facility] Provider Note   CSN: [id number] Arrival date & time: 10/03/18  [id number]     History   Chief Complaint Chief Complaint  Patient presents with  . Dental Pain    HPI Gabrielle Jordan is a 23 y.o. female.     The history is provided by the patient. No language interpreter was used.  Dental Pain Location:  Lower Lower teeth location:  20/LL 2nd bicuspid Quality:  Constant Duration:  2 days Timing:  Constant Progression:  Unchanged Chronicity:  New Context: dental fracture and poor dentition   Context: not trauma   Relieved by:  Nothing Worsened by:  Touching and jaw movement Ineffective treatments: Orajel. Associated symptoms: facial pain   Associated symptoms: no facial swelling, no fever, no gum swelling and no oral bleeding     History reviewed. No pertinent past medical history.  There are no active problems to display for this patient.   Past Surgical History:  Procedure Laterality Date  . ANKLE SURGERY       OB History    Gravida  1   Para      Term      Preterm      AB      Living        SAB      TAB      Ectopic      Multiple      Live Births               Home Medications    Prior to Admission medications   Medication Sig Start Date End Date Taking? Authorizing Provider  amoxicillin (AMOXIL) 500 MG capsule Take 1 capsule (500 mg total) by mouth 3 (three) times daily. 10/03/18   Antony MaduraHumes, Cherryl Babin, PA-C  cephALEXin (KEFLEX) 500 MG capsule Take 2 capsules (1,000 mg total) by mouth 2 (two) times daily. 06/22/18   Arby BarrettePfeiffer, Marcy, MD  HYDROcodone-acetaminophen (NORCO/VICODIN) 5-325 MG tablet Take 1-2 tablets by mouth every 6 (six) hours as needed for moderate pain or severe pain. 06/22/18   Arby BarrettePfeiffer, Marcy, MD  naproxen (NAPROSYN) 500 MG tablet Take 1 tablet (500 mg total) by mouth every 12 (twelve) hours as needed for mild pain or moderate pain. 10/03/18   Antony MaduraHumes, Sayyid Harewood, PA-C  ondansetron (ZOFRAN ODT)  4 MG disintegrating tablet Take 1 tablet (4 mg total) by mouth every 4 (four) hours as needed for nausea or vomiting. 06/22/18   Arby BarrettePfeiffer, Marcy, MD    Family History No family history on file.  Social History Social History   Tobacco Use  . Smoking status: Former Games developermoker  . Smokeless tobacco: Never Used  Substance Use Topics  . Alcohol use: Not Currently  . Drug use: Never     Allergies   Patient has no known allergies.   Review of Systems Review of Systems  Constitutional: Negative for fever.  HENT: Positive for dental problem. Negative for facial swelling.   Ten systems reviewed and are negative for acute change, except as noted in the HPI.    Physical Exam Updated Vital Signs BP 129/82 (BP Location: Right Arm)   Pulse 75   Temp 98.1 F (36.7 C) (Oral)   Resp 18   Ht 4\' 8"  (1.422 m)   Wt 76.7 kg   LMP 09/22/2018   SpO2 98%   BMI 37.89 kg/m   Physical Exam Vitals signs and nursing note reviewed.  Constitutional:      General: She is not  in acute distress.    Appearance: She is well-developed. She is not diaphoretic.     Comments: Nontoxic-appearing and in no distress  HENT:     Head: Normocephalic and atraumatic.     Mouth/Throat:      Comments: No gingival swelling or fluctuance.  No trismus.  No facial swelling.  Jaw opening symmetric with normal tongue protrusion.  Uvula midline and patient tolerating secretions without difficulty. Eyes:     General: No scleral icterus.    Conjunctiva/sclera: Conjunctivae normal.  Neck:     Musculoskeletal: Normal range of motion.     Comments: No meningismus Pulmonary:     Effort: Pulmonary effort is normal. No respiratory distress.  Musculoskeletal: Normal range of motion.  Skin:    General: Skin is warm and dry.     Coloration: Skin is not pale.     Findings: No erythema or rash.  Neurological:     Mental Status: She is alert and oriented to person, place, and time.  Psychiatric:        Behavior: Behavior  normal.      ED Treatments / Results  Labs (all labs ordered are listed, but only abnormal results are displayed) Labs Reviewed - No data to display  EKG None  Radiology No results found.  Procedures Procedures (including critical care time)  Medications Ordered in ED Medications  naproxen (NAPROSYN) tablet 500 mg (has no administration in time range)  amoxicillin (AMOXIL) capsule 500 mg (has no administration in time range)     Initial Impression / Assessment and Plan / ED Course  I have reviewed the triage vital signs and the nursing notes.  Pertinent labs & imaging results that were available during my care of the patient were reviewed by me and considered in my medical decision making (see chart for details).        Patient with toothache, onset yesterday.  No gross abscess.  Exam unconcerning for Ludwig's angina or spread of infection.  Will treat with Amoxicillin and pain medicine.  Urged patient to follow-up with dentist.  Return precautions discussed and provided. Patient discharged in stable condition with no unaddressed concerns.   Final Clinical Impressions(s) / ED Diagnoses   Final diagnoses:  [place]    ED Discharge Orders         Ordered    naproxen (NAPROSYN) 500 MG tablet  Every 12 hours PRN     10/03/18 0413    amoxicillin (AMOXIL) 500 MG capsule  3 times daily     10/03/18 0413           Antonietta Breach, PA-C 10/03/18 0420    Ward, Delice Bison, DO 10/03/18 [phone]

## 2018-10-03 NOTE — Discharge Instructions (Signed)
Follow-up with a dentist.  Only a dentist can fix your problem.  We recommend that you take amoxicillin as prescribed to treat likely underlying infection.  Take Naproxen as prescribed for pain control.  Return to the ED for any new or concerning symptoms. 

## 2018-10-11 ENCOUNTER — Encounter (HOSPITAL_COMMUNITY): Payer: Self-pay | Admitting: Emergency Medicine

## 2018-10-11 ENCOUNTER — Other Ambulatory Visit: Payer: Self-pay

## 2018-10-11 ENCOUNTER — Ambulatory Visit (HOSPITAL_COMMUNITY)
Admission: EM | Admit: 2018-10-11 | Discharge: 2018-10-11 | Disposition: A | Discharge status: Home / Self Care | Payer: Medicaid Other | Attending: Emergency Medicine | Admitting: Emergency Medicine

## 2018-10-11 DIAGNOSIS — Z3202 Encounter for pregnancy test, result negative: Secondary | ICD-10-CM

## 2018-10-11 LAB — POCT PREGNANCY, URINE: Preg Test, Ur: NEGATIVE

## 2018-10-11 NOTE — Discharge Instructions (Signed)
Pregnancy test negative, monitor for cycle to come/return to normal next month If cycle does not come on, repeat test or follow up with OBGYN

## 2018-10-11 NOTE — ED Triage Notes (Signed)
Pt requesting pregnancy test.

## 2018-10-11 NOTE — ED Provider Notes (Signed)
[facility]    CSN: [id number] Arrival date & time: 10/11/18  1634      History   Chief Complaint Chief Complaint  Patient presents with  . Possible Pregnancy    HPI Gabrielle Jordan is a 23 y.o. female no significant past medical history presenting today for evaluation of a pregnancy test.  Patient states that she is concerned that she has not had a menstrual cycle this month.  States that her last menstrual cycle was late May/early June.  She cannot remember exactly the date.  She has had some slight lower abdominal discomfort, but otherwise feels fine.  She is not on any form of birth control.  She currently has a 2018-month-old, but he is taking formula.  Denies abnormal discharge.  Denies nausea or vomiting.  Denies concerns for STDs.  HPI  History reviewed. No pertinent past medical history.  There are no active problems to display for this patient.   Past Surgical History:  Procedure Laterality Date  . ANKLE SURGERY      OB History    Gravida  1   Para      Term      Preterm      AB      Living        SAB      TAB      Ectopic      Multiple      Live Births               Home Medications    Prior to Admission medications   Not on File    Family History History reviewed. No pertinent family history.  Social History Social History   Tobacco Use  . Smoking status: Former Games developermoker  . Smokeless tobacco: Never Used  Substance Use Topics  . Alcohol use: Not Currently  . Drug use: Never     Allergies   Patient has no known allergies.   Review of Systems Review of Systems  Constitutional: Negative for fever.  Respiratory: Negative for shortness of breath.   Cardiovascular: Negative for chest pain.  Gastrointestinal: Positive for abdominal pain. Negative for diarrhea, nausea and vomiting.  Genitourinary: Negative for dysuria, flank pain, genital sores, hematuria, menstrual problem, vaginal bleeding, vaginal discharge and  vaginal pain.  Musculoskeletal: Negative for back pain.  Skin: Negative for rash.  Neurological: Negative for dizziness, light-headedness and headaches.     Physical Exam Triage Vital Signs ED Triage Vitals  Enc Vitals Group     BP 10/11/18 1715 125/82     Pulse Rate 10/11/18 1715 99     Resp 10/11/18 1715 18     Temp 10/11/18 1715 97.8 F (36.6 C)     Temp Source 10/11/18 1715 Temporal     SpO2 10/11/18 1715 97 %     Weight --      Height --      Head Circumference --      Peak Flow --      Pain Score 10/11/18 1716 0     Pain Loc --      Pain Edu? --      Excl. in GC? --    No data found.  Updated Vital Signs BP 125/82 (BP Location: Right Arm)   Pulse 99   Temp 97.8 F (36.6 C) (Temporal)   Resp 18   LMP 09/22/2018   SpO2 97%   Breastfeeding Unknown   Visual Acuity Right Eye Distance:  Left Eye Distance:   Bilateral Distance:    Right Eye Near:   Left Eye Near:    Bilateral Near:     Physical Exam Vitals signs and nursing note reviewed.  Constitutional:      General: She is not in acute distress.    Appearance: She is well-developed.     Comments: Tending to baby, well-appearing, no acute distress  HENT:     Head: Normocephalic and atraumatic.  Eyes:     Conjunctiva/sclera: Conjunctivae normal.  Neck:     Musculoskeletal: Neck supple.  Cardiovascular:     Rate and Rhythm: Normal rate and regular rhythm.     Heart sounds: No murmur.  Pulmonary:     Effort: Pulmonary effort is normal. No respiratory distress.     Breath sounds: Normal breath sounds.     Comments: Breathing comfortably at rest, CTABL, no wheezing, rales or other adventitious sounds auscultated Abdominal:     Palpations: Abdomen is soft.     Tenderness: There is no abdominal tenderness.     Comments: Soft, nondistended, nontender to light and deep palpation throughout abdomen  Skin:    General: Skin is warm and dry.  Neurological:     Mental Status: She is alert.      UC  Treatments / Results  Labs (all labs ordered are listed, but only abnormal results are displayed) Labs Reviewed  POC URINE PREG, ED    EKG   Radiology No results found.  Procedures Procedures (including critical care time)  Medications Ordered in UC Medications - No data to display  Initial Impression / Assessment and Plan / UC Course  I have reviewed the triage vital signs and the nursing notes.  Pertinent labs & imaging results that were available during my care of the patient were reviewed by me and considered in my medical decision making (see chart for details).     Pregnancy test negative.  Recommended to continue to monitor for onset of cycle or return to normal next month.  Will have follow-up with OB/GYN if cycle not returning.  Advised to repeat pregnancy test if still not having cycle in another 1 to 2 weeks.Discussed strict return precautions. Patient verbalized understanding and is agreeable with plan.  Final Clinical Impressions(s) / UC Diagnoses   Final diagnoses:  Encounter for pregnancy test with result negative     Discharge Instructions     Pregnancy test negative, monitor for cycle to come/return to normal next month If cycle does not come on, repeat test or follow up with OBGYN   ED Prescriptions    None     Controlled Substance Prescriptions [place] Controlled Substance Registry consulted? Not Applicable   Janith L[place], [place] 10/11/18 1746

## 2018-10-13 ENCOUNTER — Emergency Department (HOSPITAL_COMMUNITY): Payer: Medicaid Other

## 2018-10-13 ENCOUNTER — Emergency Department (HOSPITAL_COMMUNITY)
Admission: EM | Admit: 2018-10-13 | Discharge: 2018-10-13 | Disposition: A | Discharge status: Home / Self Care | Payer: Medicaid Other | Attending: Emergency Medicine | Admitting: Emergency Medicine

## 2018-10-13 ENCOUNTER — Other Ambulatory Visit: Payer: Self-pay

## 2018-10-13 ENCOUNTER — Encounter (HOSPITAL_COMMUNITY): Payer: Self-pay | Admitting: Emergency Medicine

## 2018-10-13 DIAGNOSIS — Y92009 Unspecified place in unspecified non-institutional (private) residence as the place of occurrence of the external cause: Secondary | ICD-10-CM | POA: Diagnosis not present

## 2018-10-13 DIAGNOSIS — Y999 Unspecified external cause status: Secondary | ICD-10-CM | POA: Insufficient documentation

## 2018-10-13 DIAGNOSIS — S93504A Unspecified sprain of right lesser toe(s), initial encounter: Secondary | ICD-10-CM | POA: Insufficient documentation

## 2018-10-13 DIAGNOSIS — Z87891 Personal history of nicotine dependence: Secondary | ICD-10-CM | POA: Diagnosis not present

## 2018-10-13 DIAGNOSIS — S93509A Unspecified sprain of unspecified toe(s), initial encounter: Secondary | ICD-10-CM

## 2018-10-13 DIAGNOSIS — Y9301 Activity, walking, marching and hiking: Secondary | ICD-10-CM | POA: Diagnosis not present

## 2018-10-13 DIAGNOSIS — W010XXA Fall on same level from slipping, tripping and stumbling without subsequent striking against object, initial encounter: Secondary | ICD-10-CM | POA: Diagnosis not present

## 2018-10-13 DIAGNOSIS — S99921A Unspecified injury of right foot, initial encounter: Secondary | ICD-10-CM | POA: Diagnosis present

## 2018-10-13 MED ORDER — IBUPROFEN 800 MG PO TABS
800.0000 mg | ORAL_TABLET | Freq: Once | ORAL | Status: AC
  Administered 2018-10-13: 800 mg via ORAL
  Filled 2018-10-13: qty 1

## 2018-10-13 MED ORDER — IBUPROFEN 600 MG PO TABS: 600.0000 mg | ORAL_TABLET | Freq: Four times a day (QID) | ORAL | 0 refills | Status: DC | PRN

## 2018-10-13 NOTE — ED Provider Notes (Signed)
[facility]-EMERGENCY DEPT Provider Note   CSN: [id number] Arrival date & time: 10/13/18  0708     History   Chief Complaint Chief Complaint  Patient presents with  . Fall  . Toe Injury    HPI Gabrielle Jordan is a 23 y.o. female.     The history is provided by the patient. No language interpreter was used.  Fall     23 year old female presenting for evaluation of right second toe injury.  Patient states this morning she was walking to the bathroom to shower.  She was holding a bar of soap when it slipped off her hand, she was trying to retrieve it as it fell down the steps and the process she lost control and injured her right foot.  States she slipped and her toes got caught follows with acute onset of sharp throbbing pain to the second toe.  Pain is moderate in severity, worse with ambulation, bear weight, or with palpation.  Pain is nonradiating.  No other injury.  Denies any numbness.  No complaint of ankle pain.  History reviewed. No pertinent past medical history.  There are no active problems to display for this patient.   Past Surgical History:  Procedure Laterality Date  . ANKLE SURGERY       OB History    Gravida  1   Para      Term      Preterm      AB      Living        SAB      TAB      Ectopic      Multiple      Live Births               Home Medications    Prior to Admission medications   Not on File    Family History No family history on file.  Social History Social History   Tobacco Use  . Smoking status: Former Games developermoker  . Smokeless tobacco: Never Used  Substance Use Topics  . Alcohol use: Not Currently  . Drug use: Never     Allergies   Patient has no known allergies.   Review of Systems Review of Systems  Constitutional: Negative for fever.  Musculoskeletal: Positive for arthralgias.  Skin: Negative for wound.  Neurological: Negative for numbness.     Physical Exam Updated Vital  Signs BP 119/86 (BP Location: Left Arm)   Pulse 90   Temp 98 F (36.7 C) (Oral)   Resp 18   LMP 09/22/2018   SpO2 100%   Physical Exam Vitals signs and nursing note reviewed.  Constitutional:      General: She is not in acute distress.    Appearance: She is well-developed.  HENT:     Head: Atraumatic.  Eyes:     Conjunctiva/sclera: Conjunctivae normal.  Neck:     Musculoskeletal: Neck supple.  Musculoskeletal:        General: Tenderness (Right foot: Exquisite tenderness about the second toe on palpation with mild swelling noted but no crepitus and no obvious deformity.  Brisk cap refill.  No other signs of injury.) present.  Skin:    Findings: No rash.  Neurological:     Mental Status: She is alert.      ED Treatments / Results  Labs (all labs ordered are listed, but only abnormal results are displayed) Labs Reviewed - No data to display  EKG None  Radiology Dg  Foot Complete Right  Result Date: 10/13/2018 CLINICAL DATA:  Fall today, RIGHT foot pain EXAM: RIGHT FOOT COMPLETE - 3+ VIEW COMPARISON:  None. FINDINGS: Osseous alignment is normal. Bone mineralization is normal. No fracture line or displaced fracture fragment seen. Soft tissues about the RIGHT foot are unremarkable. IMPRESSION: Negative. Electronically Signed   By: Franki Cabot M.D.   On: 10/13/2018 09:00    Procedures Procedures (including critical care time)  Medications Ordered in ED Medications  ibuprofen (ADVIL) tablet 800 mg (has no administration in time range)     Initial Impression / Assessment and Plan / ED Course  I have reviewed the triage vital signs and the nursing notes.  Pertinent labs & imaging results that were available during my care of the patient were reviewed by me and considered in my medical decision making (see chart for details).        BP 119/86 (BP Location: Left Arm)   Pulse 90   Temp 98 F (36.7 C) (Oral)   Resp 18   LMP 09/22/2018   SpO2 100%    Final  Clinical Impressions(s) / ED Diagnoses   Final diagnoses:  Toe sprain, initial encounter    ED Discharge Orders         Ordered    ibuprofen (ADVIL) 600 MG tablet  Every 6 hours PRN     10/13/18 0914         9:13 AM Patient fell and injured her right second toe.  X-ray today obtained showing no evidence of acute fracture or dislocation.  Likely toe sprain.  Will provide post-op shoe for support.  Rice therapy discussed.   Domenic Moras, PA-C 10/13/18 2094    Milton Ferguson, MD 10/13/18 [phone]

## 2018-10-13 NOTE — ED Triage Notes (Signed)
Pt reports that around 3am today she slipped on some soap she dropped on the step and fell hurting her 2nd toe. Pain and swelling become worse esp with weight bearing.

## 2018-11-25 ENCOUNTER — Inpatient Hospital Stay (HOSPITAL_COMMUNITY)
Admission: EM | Admit: 2018-11-25 | Discharge: 2018-11-25 | Disposition: A | Discharge status: Home / Self Care | Payer: Medicaid Other | Attending: Obstetrics & Gynecology | Admitting: Obstetrics & Gynecology

## 2018-11-25 ENCOUNTER — Inpatient Hospital Stay (HOSPITAL_COMMUNITY): Payer: Medicaid Other

## 2018-11-25 ENCOUNTER — Encounter (HOSPITAL_COMMUNITY): Payer: Self-pay

## 2018-11-25 ENCOUNTER — Other Ambulatory Visit: Payer: Self-pay

## 2018-11-25 DIAGNOSIS — O3680X0 Pregnancy with inconclusive fetal viability, not applicable or unspecified: Secondary | ICD-10-CM | POA: Diagnosis not present

## 2018-11-25 DIAGNOSIS — Z87442 Personal history of urinary calculi: Secondary | ICD-10-CM | POA: Insufficient documentation

## 2018-11-25 DIAGNOSIS — O26891 Other specified pregnancy related conditions, first trimester: Secondary | ICD-10-CM | POA: Diagnosis present

## 2018-11-25 DIAGNOSIS — Z3201 Encounter for pregnancy test, result positive: Secondary | ICD-10-CM

## 2018-11-25 DIAGNOSIS — R1030 Lower abdominal pain, unspecified: Secondary | ICD-10-CM | POA: Diagnosis not present

## 2018-11-25 DIAGNOSIS — Z3A00 Weeks of gestation of pregnancy not specified: Secondary | ICD-10-CM | POA: Diagnosis not present

## 2018-11-25 DIAGNOSIS — F17290 Nicotine dependence, other tobacco product, uncomplicated: Secondary | ICD-10-CM | POA: Insufficient documentation

## 2018-11-25 DIAGNOSIS — R109 Unspecified abdominal pain: Secondary | ICD-10-CM

## 2018-11-25 DIAGNOSIS — O26899 Other specified pregnancy related conditions, unspecified trimester: Secondary | ICD-10-CM

## 2018-11-25 HISTORY — DX: Calculus of kidney: N20.0

## 2018-11-25 LAB — CBC WITH DIFFERENTIAL/PLATELET
Abs Immature Granulocytes: 0 10*3/uL (ref 0.00–0.07)
Basophils Absolute: 0 10*3/uL (ref 0.0–0.1)
Basophils Relative: 1 %
Eosinophils Absolute: 0.1 10*3/uL (ref 0.0–0.5)
Eosinophils Relative: 1 %
HCT: 36.6 % (ref 36.0–46.0)
Hemoglobin: 11.7 g/dL — ABNORMAL LOW (ref 12.0–15.0)
Immature Granulocytes: 0 %
Lymphocytes Relative: 31 %
Lymphs Abs: 1.9 10*3/uL (ref 0.7–4.0)
MCH: 28.5 pg (ref 26.0–34.0)
MCHC: 32 g/dL (ref 30.0–36.0)
MCV: 89.3 fL (ref 80.0–100.0)
Monocytes Absolute: 0.8 10*3/uL (ref 0.1–1.0)
Monocytes Relative: 13 %
Neutro Abs: 3.3 10*3/uL (ref 1.7–7.7)
Neutrophils Relative %: 54 %
Platelets: 205 10*3/uL (ref 150–400)
RBC: 4.1 MIL/uL (ref 3.87–5.11)
RDW: 15.9 % — ABNORMAL HIGH (ref 11.5–15.5)
WBC: 6.1 10*3/uL (ref 4.0–10.5)
nRBC: 0 % (ref 0.0–0.2)

## 2018-11-25 LAB — I-STAT BETA HCG BLOOD, ED (MC, WL, AP ONLY): I-stat hCG, quantitative: 183.6 m[IU]/mL — ABNORMAL HIGH (ref <5)

## 2018-11-25 LAB — ABO/RH: ABO/RH(D): B POS

## 2018-11-25 LAB — WET PREP, GENITAL
Clue Cells Wet Prep HPF POC: NONE SEEN
Sperm: NONE SEEN
Trich, Wet Prep: NONE SEEN
Yeast Wet Prep HPF POC: NONE SEEN

## 2018-11-25 LAB — POC URINE PREG, ED: Preg Test, Ur: NEGATIVE

## 2018-11-25 MED ORDER — PREPLUS 27-1 MG PO TABS: 1.0000 | ORAL_TABLET | Freq: Every day | ORAL | 13 refills | Status: DC

## 2018-11-25 NOTE — MAU Provider Note (Signed)
History     CSN: [id number]  Arrival date and time: 11/25/18 1135   First Provider Initiated Contact with Patient 11/25/18 1324      Chief Complaint  Patient presents with  . Possible Pregnancy  . Abdominal Pain   HPI Gabrielle Jordan is a 23 y.o. [id number] in early pregnancy with unknown LMP who [facility] with chief complaint of lower abdominal pain. This is a new problem, onset 2-3 days ago. Patient's pain is located bilaterally in her lower abdomen. Her pain is rated at 6-7/10 and does not radiate. She has not taken pain medicine for this complaint. She denies aggravating or alleviating factors. She denies abdominal tenderness, vaginal bleeding, dysuria, fever or recent illness. She is remote from intercourse. She declines pain medicine in MAU.  OB History    Gravida  6   Para  4   Term  4   Preterm      AB  1   Living  4     SAB  1   TAB      Ectopic      Multiple      Live Births  4           Past Medical History:  Diagnosis Date  . Kidney stones     Past Surgical History:  Procedure Laterality Date  . ANKLE SURGERY      History reviewed. No pertinent family history.  Social History   Tobacco Use  . Smoking status: Current Every Day Smoker    Types: Cigars  . Smokeless tobacco: Never Used  . Tobacco comment: 2 per day   Substance Use Topics  . Alcohol use: Not Currently  . Drug use: Never    Allergies: No Known Allergies  Medications Prior to Admission  Medication Sig Dispense Refill Last Dose  . ibuprofen (ADVIL) 600 MG tablet Take 1 tablet (600 mg total) by mouth every 6 (six) hours as needed. 30 tablet 0     Review of Systems  Constitutional: Negative for chills, fatigue and fever.  Gastrointestinal: Positive for abdominal pain. Negative for nausea and vomiting.  Genitourinary: Negative for difficulty urinating, dysuria, vaginal bleeding, vaginal discharge and vaginal pain.  Musculoskeletal: Negative for back pain.   Neurological: Negative for headaches.  All other systems reviewed and are negative.  Physical Exam   Blood pressure 103/62, pulse 80, temperature 98.6 F (37 C), temperature source Oral, resp. rate 16, height 4\' 8"  (1.422 m), weight 76.5 kg, SpO2 100 %, unknown if currently breastfeeding.  Physical Exam  Nursing note and vitals reviewed. Constitutional: She is oriented to person, place, and time. She appears well-developed and well-nourished.  Cardiovascular: Normal rate.  Respiratory: Effort normal and breath sounds normal. No respiratory distress.  GI: Soft. She exhibits no distension. There is no abdominal tenderness. There is no rebound and no guarding.  Neurological: She is alert and oriented to person, place, and time.  Skin: Skin is warm and dry.  Psychiatric: She has a normal mood and affect. Her behavior is normal. Judgment and thought content normal.   MAU Course/MDM  Procedures  Patient Vitals for the past 24 hrs:  BP Temp Temp src Pulse Resp SpO2 Height Weight  11/25/18 1546 (!) 138/56 - - - 16 - - -  11/25/18 1306 103/62 98.6 F (37 C) Oral 80 16 100 % - -  11/25/18 1303 - - - - - - 4\' 8"  (1.422 m) 76.5 kg  11/25/18 1157 - - - - - -  4\' 8"  (1.422 m) 72.6 kg  11/25/18 1147 119/66 98.5 F (36.9 C) Oral 92 17 - - -   Results for orders placed or performed during the hospital encounter of 11/25/18 (from the past 24 hour(s))  POC Urine Pregnancy, ED (not at [facility])     Status: None   Collection Time: 11/25/18 11:47 AM  Result Value Ref Range   Preg Test, Ur NEGATIVE NEGATIVE  I-Stat Beta hCG blood, ED ([facility], [facility], [facility] only)     Status: Abnormal   Collection Time: 11/25/18 12:04 PM  Result Value Ref Range   I-stat hCG, quantitative 183.6 (H) <5 mIU/mL   Comment 3          ABO/Rh     Status: None   Collection Time: 11/25/18  1:29 PM  Result Value Ref Range   ABO/RH(D) B POS    No rh immune globuloin      NOT A RH IMMUNE GLOBULIN CANDIDATE, PT RH POSITIVE Performed at  [facility], [place] [address], [place], [place] [address]   CBC with Differential/Platelet     Status: Abnormal   Collection Time: 11/25/18  1:33 PM  Result Value Ref Range   WBC 6.1 4.0 - 10.5 K/uL   RBC 4.10 3.87 - 5.11 MIL/uL   Hemoglobin 11.7 (L) 12.0 - 15.0 g/dL   HCT 36.6 36.0 - 46.0 %   MCV 89.3 80.0 - 100.0 fL   MCH 28.5 26.0 - 34.0 pg   MCHC 32.0 30.0 - 36.0 g/dL   RDW 15.9 (H) 11.5 - 15.5 %   Platelets 205 150 - 400 K/uL   nRBC 0.0 0.0 - 0.2 %   Neutrophils Relative % 54 %   Neutro Abs 3.3 1.7 - 7.7 K/uL   Lymphocytes Relative 31 %   Lymphs Abs 1.9 0.7 - 4.0 K/uL   Monocytes Relative 13 %   Monocytes Absolute 0.8 0.1 - 1.0 K/uL   Eosinophils Relative 1 %   Eosinophils Absolute 0.1 0.0 - 0.5 K/uL   Basophils Relative 1 %   Basophils Absolute 0.0 0.0 - 0.1 K/uL   Immature Granulocytes 0 %   Abs Immature Granulocytes 0.00 0.00 - 0.07 K/uL  Wet prep, genital     Status: Abnormal   Collection Time: 11/25/18  1:35 PM   Specimen: Vaginal  Result Value Ref Range   Yeast Wet Prep HPF POC NONE SEEN NONE SEEN   Trich, Wet Prep NONE SEEN NONE SEEN   Clue Cells Wet Prep HPF POC NONE SEEN NONE SEEN   WBC, Wet Prep HPF POC FEW (A) NONE SEEN   Sperm NONE SEEN    US Ob Less Than 14 Weeks With Ob Transvaginal  Result Date: 11/25/2018 CLINICAL DATA:  23 year old pregnant female with pelvic pain. Beta HCG of 183. EXAM: OBSTETRIC <14 WK [place] AND TRANSVAGINAL OB US TECHNIQUE: Both transabdominal and transvaginal ultrasound examinations were performed for complete evaluation of the gestation as well as the maternal uterus, adnexal regions, and pelvic cul-de-sac. Transvaginal technique was performed to assess early pregnancy. COMPARISON:  None. FINDINGS: Intrauterine gestational sac: None Yolk sac:  Not Visualized. Embryo:  Not Visualized. Maternal uterus/adnexae: The ovaries are unremarkable. A trace amount of free fluid is noted. No adnexal mass identified. IMPRESSION: No IUP or adnexal  mass visualized. Differential diagnosis includes recent spontaneous abortion, IUP too early to visualize, and occult ectopic pregnancy. Recommend close follow up of quantitative B-HCG levels, and follow up US as clinically warranted.  Electronically Signed   By: Harmon Pier M.D.   On: 11/25/2018 15:31   Meds ordered this encounter  Medications  . Prenatal Vit-Fe Fumarate-FA (PREPLUS) 27-1 MG TABS    Sig: Take 1 tablet by mouth daily.    Dispense:  30 tablet    Refill:  13    Order Specific Question:   Supervising Provider    Answer:   Adam Phenix [[id number]]   Assessment and Plan  --23 y.o. [phone] with early pregnancy of unknown location, unknown LMP --Quant hCG 183.6 --Declines pain medicine --Discharge home in stable condition with ectopic/bleeding precautions  F/U: Pt scheduled for repeat Quant hCG at [facility] Tuesday afternoon 11/27/18  Calvert Cantor, CNM 11/25/2018, 4:15 PM

## 2018-11-25 NOTE — ED Triage Notes (Signed)
Patient has been having cramping for 3 days, positive pregnancy tests at home. Wants confirmation and to know how far along she is.

## 2018-11-25 NOTE — MAU Note (Signed)
Gabrielle Jordan is a 23 y.o. here in MAU reporting: having cramping for a couple of days. No bleeding. No abnormal discharge. + HPT yesterday  LMP: pt reports she cannot remember when her LMP was  Onset of complaint: a couple of days  Pain score: 6/10  Vitals:   11/25/18 1147 11/25/18 1306  BP: 119/66 103/62  Pulse: 92 80  Resp: 17 16  Temp: 98.5 F (36.9 C) 98.6 F (37 C)  SpO2:  100%

## 2018-11-25 NOTE — ED Notes (Signed)
Report called to MAU. Pt transport called for MAU as well by this RN.

## 2018-11-25 NOTE — ED Triage Notes (Signed)
Pt. Stated, Gabrielle Jordan already had 3 home pregnancies and they were positive

## 2018-11-25 NOTE — Discharge Instructions (Signed)
Prenatal Care Providers           [facility] [place] [facility]   Phone: [phone]  [facility] [place] @ [place]   Phone: [place] [facility]  [facility]       Phone: [phone]            [facility] [place] @ [place]     Phone: [place] for [place] @ [employer]   Phone: [place] [facility] [place] [facility]  Phone: [place] [facility] [place] [facility] Phone: [place] [facility]  Phone: [facility]  Phone: [facility] Phone: [phone]  [facility] Phone: [phone]  [facility] Phone: [phone]  [facility] Phone: [phone]  [facility]  Phone: [phone]    Abdominal Pain During Pregnancy  Belly (abdominal) pain is common during pregnancy. There are many possible causes. Most of the time, it is not a serious problem. Other times, it can be a sign that something is wrong with the pregnancy. Always tell your doctor if you have belly pain. Follow these instructions at home:  Do not have sex or put anything in your vagina until your pain goes away completely.  Get plenty of rest until your pain gets better.  Drink enough fluid to keep your pee (urine) pale yellow.  Take over-the-counter and prescription medicines only as told by your doctor.  Keep all follow-up visits as told by your doctor. This is important. Contact a doctor if:  Your pain continues or gets worse after resting.  You have lower belly pain that: ? Comes and goes at regular times. ? Spreads to your back. ? Feels like menstrual cramps.  You have pain or burning when you pee (urinate). Get help right away if:  You have a fever or chills.  You have vaginal bleeding.  You are leaking fluid from your  vagina.  You are passing tissue from your vagina.  You throw up (vomit) for more than 24 hours.  You have watery poop (diarrhea) for more than 24 hours.  Your baby is moving less than usual.  You feel very weak or faint.  You have shortness of breath.  You have very bad pain in your upper belly. Summary  Belly (abdominal) pain is common during pregnancy. There are many possible causes.  If you have belly pain during pregnancy, tell your doctor right away.  Keep all follow-up visits as told by your doctor. This is important. This information is not intended to replace advice given to you by your health care provider. Make sure you discuss any questions you have with your health care provider. Document Released: 03/02/2009 Document Revised: 07/02/2018 Document Reviewed: 06/16/2016 Elsevier Patient Education  2020 [employer].

## 2018-11-26 ENCOUNTER — Telehealth: Payer: Self-pay | Admitting: Family Medicine

## 2018-11-26 NOTE — Telephone Encounter (Signed)
Spoke to patient about her appointment on 9/1 @ 2:00. Patient instructed to wear a face mask for the entire appointment and no visitors are allowed with her during the visit. Patient screened for covid symptoms and denied having any

## 2018-11-27 ENCOUNTER — Ambulatory Visit: Payer: Medicaid Other

## 2018-11-27 LAB — GC/CHLAMYDIA PROBE AMP (~~LOC~~) NOT AT ARMC
Chlamydia: NEGATIVE
Neisseria Gonorrhea: NEGATIVE

## 2018-12-02 ENCOUNTER — Inpatient Hospital Stay (HOSPITAL_COMMUNITY)
Admission: AD | Admit: 2018-12-02 | Discharge: 2018-12-03 | Disposition: A | Discharge status: Home / Self Care | Payer: Medicaid Other | Attending: Obstetrics & Gynecology | Admitting: Obstetrics & Gynecology

## 2018-12-02 ENCOUNTER — Other Ambulatory Visit: Payer: Self-pay

## 2018-12-02 DIAGNOSIS — O208 Other hemorrhage in early pregnancy: Secondary | ICD-10-CM | POA: Insufficient documentation

## 2018-12-02 DIAGNOSIS — Z3491 Encounter for supervision of normal pregnancy, unspecified, first trimester: Secondary | ICD-10-CM

## 2018-12-02 DIAGNOSIS — O418X10 Other specified disorders of amniotic fluid and membranes, first trimester, not applicable or unspecified: Secondary | ICD-10-CM

## 2018-12-02 DIAGNOSIS — O26899 Other specified pregnancy related conditions, unspecified trimester: Secondary | ICD-10-CM

## 2018-12-02 DIAGNOSIS — O468X1 Other antepartum hemorrhage, first trimester: Secondary | ICD-10-CM

## 2018-12-02 DIAGNOSIS — O26891 Other specified pregnancy related conditions, first trimester: Secondary | ICD-10-CM | POA: Insufficient documentation

## 2018-12-02 DIAGNOSIS — Z87442 Personal history of urinary calculi: Secondary | ICD-10-CM | POA: Insufficient documentation

## 2018-12-02 DIAGNOSIS — Z3A01 Less than 8 weeks gestation of pregnancy: Secondary | ICD-10-CM

## 2018-12-02 DIAGNOSIS — Z87891 Personal history of nicotine dependence: Secondary | ICD-10-CM | POA: Insufficient documentation

## 2018-12-02 DIAGNOSIS — R109 Unspecified abdominal pain: Secondary | ICD-10-CM | POA: Insufficient documentation

## 2018-12-02 DIAGNOSIS — O4690 Antepartum hemorrhage, unspecified, unspecified trimester: Secondary | ICD-10-CM

## 2018-12-02 NOTE — MAU Note (Signed)
Pt missed her follow up appt on Sep 1st as she was in [place]. She started bleeding this morning from 1000- 1300.   She states it was a very small amt on a ;panty liner. Since 1300 today, she has only noticed red blood on tissue after wiping. Reports low abd cramping most of today that has become more often and stronger tonight.

## 2018-12-03 ENCOUNTER — Inpatient Hospital Stay (HOSPITAL_COMMUNITY): Payer: Medicaid Other

## 2018-12-03 ENCOUNTER — Other Ambulatory Visit: Payer: Self-pay

## 2018-12-03 ENCOUNTER — Encounter (HOSPITAL_COMMUNITY): Payer: Self-pay | Admitting: *Deleted

## 2018-12-03 DIAGNOSIS — Z3491 Encounter for supervision of normal pregnancy, unspecified, first trimester: Secondary | ICD-10-CM | POA: Diagnosis not present

## 2018-12-03 DIAGNOSIS — N939 Abnormal uterine and vaginal bleeding, unspecified: Secondary | ICD-10-CM | POA: Diagnosis present

## 2018-12-03 DIAGNOSIS — O208 Other hemorrhage in early pregnancy: Secondary | ICD-10-CM | POA: Diagnosis not present

## 2018-12-03 DIAGNOSIS — O468X1 Other antepartum hemorrhage, first trimester: Secondary | ICD-10-CM

## 2018-12-03 DIAGNOSIS — O26891 Other specified pregnancy related conditions, first trimester: Secondary | ICD-10-CM | POA: Diagnosis not present

## 2018-12-03 DIAGNOSIS — Z3A01 Less than 8 weeks gestation of pregnancy: Secondary | ICD-10-CM | POA: Diagnosis not present

## 2018-12-03 DIAGNOSIS — O4691 Antepartum hemorrhage, unspecified, first trimester: Secondary | ICD-10-CM | POA: Diagnosis not present

## 2018-12-03 DIAGNOSIS — Z87442 Personal history of urinary calculi: Secondary | ICD-10-CM | POA: Diagnosis not present

## 2018-12-03 DIAGNOSIS — Z87891 Personal history of nicotine dependence: Secondary | ICD-10-CM | POA: Diagnosis not present

## 2018-12-03 DIAGNOSIS — R109 Unspecified abdominal pain: Secondary | ICD-10-CM | POA: Diagnosis not present

## 2018-12-03 DIAGNOSIS — O418X10 Other specified disorders of amniotic fluid and membranes, first trimester, not applicable or unspecified: Secondary | ICD-10-CM

## 2018-12-03 LAB — CBC
HCT: 38.5 % (ref 36.0–46.0)
Hemoglobin: 12.1 g/dL (ref 12.0–15.0)
MCH: 28.4 pg (ref 26.0–34.0)
MCHC: 31.4 g/dL (ref 30.0–36.0)
MCV: 90.4 fL (ref 80.0–100.0)
Platelets: 240 10*3/uL (ref 150–400)
RBC: 4.26 MIL/uL (ref 3.87–5.11)
RDW: 15.9 % — ABNORMAL HIGH (ref 11.5–15.5)
WBC: 6.3 10*3/uL (ref 4.0–10.5)
nRBC: 0 % (ref 0.0–0.2)

## 2018-12-03 LAB — HCG, QUANTITATIVE, PREGNANCY: hCG, Beta Chain, Quant, S: 3013 m[IU]/mL — ABNORMAL HIGH (ref <5)

## 2018-12-03 LAB — WET PREP, GENITAL
Sperm: NONE SEEN
Trich, Wet Prep: NONE SEEN
Yeast Wet Prep HPF POC: NONE SEEN

## 2018-12-03 MED ORDER — OXYCODONE-ACETAMINOPHEN 5-325 MG PO TABS
1.0000 | ORAL_TABLET | Freq: Once | ORAL | Status: AC
  Administered 2018-12-03: 1 via ORAL
  Filled 2018-12-03: qty 1

## 2018-12-03 NOTE — MAU Provider Note (Signed)
History     CSN: [id number]  Arrival date and time: 12/02/18 2238   First Provider Initiated Contact with Patient 12/03/18 0023      Chief Complaint  Patient presents with  . Vaginal Bleeding  . Abdominal Pain   Gabrielle Jordan is a 23 y.o. G6P4 at [redacted]w[redacted]d by LMP who presents to MAU for vaginal bleeding and abd pain. She reports being seen in MAU on 8/30 for abdominal pain and noted to have no IUP and needed repeat HCG. She did not make it to her follow up appointment scheduled for 9/1. Reports she started having vaginal bleeding once last week then it stopped. Today the vaginal bleeding started as light pink spotting then became brighter and is not bright red. She reports having to wear a panty liner but denies soaking through liner. She reports vaginal bleeding is associated with abdominal pain. Describes as lower abdominal cramping that is intermittent, rates pain 9/10- requesting pain medication. "feels like I am having a miscarriage with the pain".    OB History    Gravida  6   Para  4   Term  4   Preterm      AB  1   Living  4     SAB  1   TAB      Ectopic      Multiple      Live Births  4           Past Medical History:  Diagnosis Date  . Kidney stones     Past Surgical History:  Procedure Laterality Date  . ANKLE SURGERY    . HEAD & NECK SKIN LESION EXCISIONAL BIOPSY      History reviewed. No pertinent family history.  Social History   Tobacco Use  . Smoking status: Former Smoker    Types: Cigars    Quit date: 11/25/2018    Years since quitting: 0.0  . Smokeless tobacco: Never Used  . Tobacco comment: 2 per day   Substance Use Topics  . Alcohol use: Not Currently  . Drug use: Never    Allergies: No Known Allergies  Medications Prior to Admission  Medication Sig Dispense Refill Last Dose  . Prenatal Vit-Fe Fumarate-FA (PREPLUS) 27-1 MG TABS Take 1 tablet by mouth daily. 30 tablet 13     Review of Systems  Constitutional: Negative.    Respiratory: Negative.   Cardiovascular: Negative.   Gastrointestinal: Positive for abdominal pain. Negative for constipation, diarrhea, nausea and vomiting.  Genitourinary: Positive for vaginal bleeding. Negative for difficulty urinating, dysuria, pelvic pain and urgency.  Musculoskeletal: Negative.   Neurological: Negative.    Physical Exam   Blood pressure 115/60, pulse 92, last menstrual period 10/30/2018, unknown if currently breastfeeding.  Physical Exam  Nursing note and vitals reviewed. Constitutional: She is oriented to person, place, and time. She appears well-developed and well-nourished. No distress.  Cardiovascular: Normal rate and regular rhythm.  Respiratory: Breath sounds normal. No respiratory distress. She has no wheezes.  GI: Soft. She exhibits no distension. There is no abdominal tenderness. There is no rebound.  Genitourinary:    No vaginal bleeding.  No bleeding in the vagina.    Genitourinary Comments: Pelvic exam: Cervix pink, visually closed, cervix friable, without lesion, scant white creamy discharge, no vaginal bleeding noted, vaginal walls and external genitalia normal Bimanual exam: Cervix 0/long/high, firm, anterior, neg CMT, uterus nontender, nonenlarged, adnexa without tenderness, enlargement, or mass   Musculoskeletal: Normal range of motion.  General: No edema.  Neurological: She is alert and oriented to person, place, and time.  Skin: Skin is warm and dry.  Psychiatric: She has a normal mood and affect. Her behavior is normal. Thought content normal.    MAU Course  Procedures  MDM Orders Placed This Encounter  Procedures  . Wet prep, genital  . US OB Transvaginal  . US OB Transvaginal  . CBC  . hCG, quantitative, pregnancy   Patient HCG went from 183 on 8/30 to 3,013 today- repeat US ordered.  Patient reports pain of 9/10, requesting pain medication. One dose of percocet given to patient prior to US to be able to tolerate.   Labs  and US reports reviewed:  Results for orders placed or performed during the hospital encounter of 12/02/18 (from the past 48 hour(s))  CBC     Status: Abnormal   Collection Time: 12/03/18 12:01 AM  Result Value Ref Range   WBC 6.3 4.0 - 10.5 K/uL   RBC 4.26 3.87 - 5.11 MIL/uL   Hemoglobin 12.1 12.0 - 15.0 g/dL   HCT 40.938.5 81.136.0 - 91.446.0 %   MCV 90.4 80.0 - 100.0 fL   MCH 28.4 26.0 - 34.0 pg   MCHC 31.4 30.0 - 36.0 g/dL   RDW 78.215.9 (H) 95.611.5 - 21.315.5 %   Platelets 240 150 - 400 K/uL   nRBC 0.0 0.0 - 0.2 %    Comment: Performed at [facility], [address] [place], [place] [address]  hCG, quantitative, pregnancy     Status: Abnormal   Collection Time: 12/03/18 12:01 AM  Result Value Ref Range   hCG, Beta Chain, Quant, S 3,013 (H) <5 mIU/mL    Comment:          GEST. AGE      CONC.  (mIU/mL)   <=1 WEEK        5 - 50     2 WEEKS       50 - 500     3 WEEKS       100 - 10,000     4 WEEKS     1,000 - 30,000     5 WEEKS     3,500 - 115,000   6-8 WEEKS     12,000 - 270,000    12 WEEKS     15,000 - 220,000        FEMALE AND NON-PREGNANT FEMALE:     LESS THAN 5 mIU/mL Performed at [facility], [address] [place], [place] [address]   Wet prep, genital     Status: Abnormal   Collection Time: 12/03/18 12:45 AM   Specimen: Thin Prep Cervical/Endocervical  Result Value Ref Range   Yeast Wet Prep HPF POC NONE SEEN NONE SEEN   Trich, Wet Prep NONE SEEN NONE SEEN   Clue Cells Wet Prep HPF POC PRESENT (A) NONE SEEN   WBC, Wet Prep HPF POC MANY (A) NONE SEEN   Sperm NONE SEEN     Comment: Performed at [facility], [address] [place], [place] [address]   [place] Ob Transvaginal  Result Date: 12/03/2018 CLINICAL DATA:  Abdominal pain EXAM: OBSTETRIC <14 WK US AND TRANSVAGINAL OB US TECHNIQUE: Both transabdominal and transvaginal ultrasound examinations were performed for complete evaluation of the gestation as well as the maternal uterus, adnexal regions, and pelvic  cul-de-sac. Transvaginal technique was performed to assess early pregnancy. COMPARISON:  November 25, 2018 FINDINGS: Intrauterine  gestational sac: Single Yolk sac:  Visualized. Embryo:  Not Visualized. Cardiac Activity: Not Visualized. MSD: 4.7 mm   5 w   1 d Subchorionic hemorrhage:  None visualized. Maternal uterus/adnexae: There is a corpus luteum cyst seen within the left ovary. The right ovary is normal appearance. IMPRESSION: Probable early intrauterine gestational sac and yolk sac, but no fetal pole, or cardiac activity yet visualized. Recommend follow-up quantitative B-HCG levels and follow-up US in 14 days to assess viability. This recommendation follows SRU consensus guidelines: Diagnostic Criteria for Nonviable Pregnancy Early in the First Trimester. [place] Med 2013; 093:2671-24. Electronically Signed   By: Prudencio Pair M.D.   On: 12/03/2018 02:04   Result reviewed with patient. Order placed for f/u US in 2 weeks to assess viability. Threatened miscarriage precautions and [facility] precautions discussed with patient and reasons to return to MAU. Encouraged to make initial prenatal appointment. Pt discharged in stable condition.    Assessment and Plan   1. Normal IUP (intrauterine pregnancy) on prenatal ultrasound, first trimester   2. Abdominal pain during pregnancy   3. Vaginal bleeding during pregnancy   4. [redacted] weeks gestation of pregnancy   5. Subchorionic hematoma in first trimester, single or unspecified fetus    Discharge home  Follow up as scheduled for repeat US  Return as needed to MAU  Make initial prenatal appointment     Allergies as of 12/03/2018   No Known Allergies     Medication List    TAKE these medications   PrePLUS 27-1 MG Tabs Take 1 tablet by mouth daily.       Lajean Manes 12/03/2018, 3:55 AM

## 2018-12-05 ENCOUNTER — Emergency Department (HOSPITAL_COMMUNITY)
Admission: EM | Admit: 2018-12-05 | Discharge: 2018-12-06 | Discharge status: Against Medical Advice | Payer: Medicaid Other | Attending: Emergency Medicine | Admitting: Emergency Medicine

## 2018-12-05 ENCOUNTER — Other Ambulatory Visit: Payer: Self-pay

## 2018-12-05 ENCOUNTER — Encounter (HOSPITAL_COMMUNITY): Payer: Self-pay | Admitting: Emergency Medicine

## 2018-12-05 DIAGNOSIS — O209 Hemorrhage in early pregnancy, unspecified: Secondary | ICD-10-CM | POA: Diagnosis present

## 2018-12-05 DIAGNOSIS — Z5321 Procedure and treatment not carried out due to patient leaving prior to being seen by health care provider: Secondary | ICD-10-CM | POA: Diagnosis not present

## 2018-12-05 DIAGNOSIS — Z3A01 Less than 8 weeks gestation of pregnancy: Secondary | ICD-10-CM | POA: Diagnosis not present

## 2018-12-05 LAB — GC/CHLAMYDIA PROBE AMP (~~LOC~~) NOT AT ARMC
Chlamydia: NEGATIVE
Neisseria Gonorrhea: NEGATIVE

## 2018-12-05 NOTE — ED Triage Notes (Signed)
Patient c/o intermittent vaginal bleeding x3 days. Reports [redacted] weeks pregnant. States she was seen for same on 8/30 and dx with "chronic hematoma" to placenta.

## 2018-12-06 LAB — URINALYSIS, ROUTINE W REFLEX MICROSCOPIC
Bilirubin Urine: NEGATIVE
Glucose, UA: NEGATIVE mg/dL
Hgb urine dipstick: NEGATIVE
Ketones, ur: NEGATIVE mg/dL
Nitrite: NEGATIVE
Protein, ur: NEGATIVE mg/dL
Specific Gravity, Urine: 1.023 (ref 1.005–1.030)
pH: 6 (ref 5.0–8.0)

## 2018-12-06 NOTE — ED Notes (Signed)
Patient was not in the room when nurse went to check on her. Patient was called in the waiting room and no answer.

## 2018-12-12 ENCOUNTER — Inpatient Hospital Stay (EMERGENCY_DEPARTMENT_HOSPITAL)
Admission: AD | Admit: 2018-12-12 | Discharge: 2018-12-12 | Disposition: A | Discharge status: Home / Self Care | Payer: Medicaid Other | Source: Home / Self Care | Attending: Obstetrics and Gynecology | Admitting: Obstetrics and Gynecology

## 2018-12-12 ENCOUNTER — Inpatient Hospital Stay (HOSPITAL_COMMUNITY): Payer: Medicaid Other

## 2018-12-12 ENCOUNTER — Encounter (HOSPITAL_COMMUNITY): Payer: Self-pay | Admitting: *Deleted

## 2018-12-12 ENCOUNTER — Encounter (HOSPITAL_COMMUNITY): Payer: Self-pay

## 2018-12-12 ENCOUNTER — Other Ambulatory Visit: Payer: Self-pay

## 2018-12-12 ENCOUNTER — Inpatient Hospital Stay (HOSPITAL_COMMUNITY)
Admission: AD | Admit: 2018-12-12 | Discharge: 2018-12-12 | Disposition: A | Discharge status: Home / Self Care | Payer: Medicaid Other | Attending: Obstetrics and Gynecology | Admitting: Obstetrics and Gynecology

## 2018-12-12 DIAGNOSIS — Z3491 Encounter for supervision of normal pregnancy, unspecified, first trimester: Secondary | ICD-10-CM

## 2018-12-12 DIAGNOSIS — R109 Unspecified abdominal pain: Secondary | ICD-10-CM | POA: Insufficient documentation

## 2018-12-12 DIAGNOSIS — O209 Hemorrhage in early pregnancy, unspecified: Secondary | ICD-10-CM

## 2018-12-12 DIAGNOSIS — Z3A01 Less than 8 weeks gestation of pregnancy: Secondary | ICD-10-CM

## 2018-12-12 DIAGNOSIS — O26891 Other specified pregnancy related conditions, first trimester: Secondary | ICD-10-CM

## 2018-12-12 DIAGNOSIS — O039 Complete or unspecified spontaneous abortion without complication: Secondary | ICD-10-CM | POA: Insufficient documentation

## 2018-12-12 DIAGNOSIS — O200 Threatened abortion: Secondary | ICD-10-CM | POA: Diagnosis not present

## 2018-12-12 DIAGNOSIS — Z87891 Personal history of nicotine dependence: Secondary | ICD-10-CM | POA: Insufficient documentation

## 2018-12-12 DIAGNOSIS — Z87442 Personal history of urinary calculi: Secondary | ICD-10-CM | POA: Insufficient documentation

## 2018-12-12 DIAGNOSIS — N939 Abnormal uterine and vaginal bleeding, unspecified: Secondary | ICD-10-CM | POA: Diagnosis present

## 2018-12-12 LAB — HEMOGLOBIN AND HEMATOCRIT, BLOOD
HCT: 37.9 % (ref 36.0–46.0)
Hemoglobin: 12.6 g/dL (ref 12.0–15.0)

## 2018-12-12 LAB — URINALYSIS, ROUTINE W REFLEX MICROSCOPIC
Bilirubin Urine: NEGATIVE
Glucose, UA: NEGATIVE mg/dL
Ketones, ur: 5 mg/dL — AB
Nitrite: NEGATIVE
Protein, ur: 100 mg/dL — AB
Specific Gravity, Urine: 1.032 — ABNORMAL HIGH (ref 1.005–1.030)
pH: 6 (ref 5.0–8.0)

## 2018-12-12 MED ORDER — KETOROLAC TROMETHAMINE 60 MG/2ML IM SOLN
60.0000 mg | Freq: Once | INTRAMUSCULAR | Status: AC
  Administered 2018-12-12: 60 mg via INTRAMUSCULAR
  Filled 2018-12-12: qty 2

## 2018-12-12 MED ORDER — MISOPROSTOL 200 MCG PO TABS: 800.0000 ug | ORAL_TABLET | Freq: Once | ORAL | 0 refills | Status: DC

## 2018-12-12 MED ORDER — ACETAMINOPHEN 500 MG PO TABS
1000.0000 mg | ORAL_TABLET | Freq: Once | ORAL | Status: AC
  Administered 2018-12-12: 1000 mg via ORAL
  Filled 2018-12-12: qty 2

## 2018-12-12 MED ORDER — OXYCODONE-ACETAMINOPHEN 5-325 MG PO TABS: 2.0000 | ORAL_TABLET | Freq: Four times a day (QID) | ORAL | 0 refills | Status: AC | PRN

## 2018-12-12 NOTE — Discharge Instructions (Signed)
FACTS YOU SHOULD KNOW  WHAT IS AN EARLY PREGNANCY FAILURE? Once the egg is fertilized with the sperm and begins to develop, it attaches to the lining of the uterus. This early pregnancy tissue may not develop into an embryo (the beginning stage of a baby). Sometimes an embryo does develop but does not continue to grow. These problems can be seen on ultrasound.   MANAGEMNT OF EARLY PREGNANCY FAILURE: About 4 out of 100 (0.25%) women will have a pregnancy loss in her lifetime.  One in five pregnancies is found to be an early pregnancy failure.  There are 3 ways to care for an early pregnancy failure:   (1) Surgery, (2) Medicine, (3) Waiting for you to pass the pregnancy on your own. The decision as to how to proceed after being diagnosed with and early pregnancy failure is an individual one.  The decision can be made only after appropriate counseling.  You need to weigh the pros and cons of the 3 choices. Then you can make the choice that works for you.  CYTOTEC MANAGEMENT Prostaglandins (cytotec) are the most widely used drug for this purpose. They cause the uterus to cramp and contract. You will place the medicine yourself inside your vagina (or you may be instructed to take it by mouth) in the privacy of your home. Emptying of the uterus should occur within 3 days but the process may continue for several weeks. The bleeding may seem heavy at times. POSSIBLE SIDE EFFECTS FROM CYTOTEC  Nausea   Vomiting  Diarrhea Fever  Chills  Hot Flashes Side effects  from the process of the early pregnancy failure include:  Cramping  Bleeding  Headaches  Dizziness RISKS: This is a low risk procedure. Less than 1 in 100 women has a complication. An incomplete passage of the early pregnancy may occur. Also, Hemorrhage (heavy bleeding) could happen.  Rarely the pregnancy will not be passed completely. Excessively heavy bleeding may occur.  Your doctor may need to perform surgery to empty the uterus  (D&E). Afterwards: Everybody will feel differently after the early pregnancy completion. You may have soreness or cramps for a day or two.  You may have light bleeding for up to 2 weeks. You may be as active as you feel like being. If you have any of the following problems you may call Maternity Admissions Unit at [phone].  If you have pain that does not get better  with pain medication  Bleeding that soaks through 2 thick full-sized sanitary pads in an hour  Cramps that last longer than 2 days  Foul smelling discharge  Fever above 100.4 degrees F Even if you do not have any of these symptoms, you should have a follow-up exam to make sure you are healing properly. This appointment will be made for you before you leave the hospital. Your next normal period will start again in 4-6 week after the loss. You can get pregnant soon after the loss, so use birth control right away. Finally: Make sure all your questions are answered before during and after any procedure. Follow up with medical care and family planning methods.       Managing Pregnancy Loss Pregnancy loss can happen any time during a pregnancy. Often the cause is not known. It is rarely because of anything you did. Pregnancy loss in early pregnancy (during the first trimester) is called a miscarriage. This type of pregnancy loss is the most common. Pregnancy loss that happens after 20 weeks of pregnancy  is called fetal demise if the baby's heart stops beating before birth. Fetal demise is much less common. Some women experience spontaneous labor shortly after fetal demise resulting in a stillborn birth (stillbirth). Any pregnancy loss can be devastating. You will need to recover both physically and emotionally. Most women are able to get pregnant again after a pregnancy loss and deliver a healthy baby. How to manage emotional recovery  Pregnancy loss is very hard emotionally. You may feel many different emotions while you  grieve. You may feel sad and angry. You may also feel guilty. It is normal to have periods of crying. Emotional recovery can take longer than physical recovery. It is different for everyone. Taking these steps can help you in managing this loss:  Remember that it is unlikely you did anything to cause the pregnancy loss.  Share your thoughts and feelings with friends, family, and your partner. Remember that your partner is also recovering emotionally.  Make sure you have a good support system. Do not spend too much time alone.  Meet with a pregnancy loss counselor or join a pregnancy loss support group.  Get enough sleep and eat a healthy diet. Return to regular exercise when you have recovered physically.  Do not use drugs or alcohol to manage your emotions.  Consider seeing a mental health professional to help you recover emotionally.  Ask a friend or loved one to help you decide what to do with any clothing and nursery items you received for your baby. In the case of a stillbirth, many women benefit from taking additional steps in the grieving process. You may want to:  Hold your baby after the birth.  Name your baby.  Request a birth certificate.  Create a keepsake such as handprints or footprints.  Dress your baby and have a picture taken.  Make funeral arrangements.  Ask for a baptism or blessing. Hospitals have staff members who can help you with all these arrangements. How to recognize emotional stress It is normal to have emotional stress after a pregnancy loss. But emotional stress that lasts a long time or becomes severe requires treatment. Watch out for these signs of severe emotional stress:  Sadness, anger, or guilt that is not going away and is interfering with your normal activities.  Relationship problems that have occurred or gotten worse since the pregnancy loss.  Signs of depression that last longer than 2 weeks. These may  include: ? Sadness. ? Anxiety. ? Hopelessness. ? Loss of interest in activities you enjoy. ? Inability to concentrate. ? Trouble sleeping or sleeping too much. ? Loss of appetite or overeating. ? Thoughts of death or of hurting yourself. Follow these instructions at home:  Take over-the-counter and prescription medicines only as told by your health care provider.  Rest at home until your energy level returns. Return to your normal activities as told by your health care provider. Ask your health care provider what activities are safe for you.  When you are ready, meet with your health care provider to discuss steps to take for a future pregnancy.  Keep all follow-up visits as told by your health care provider. This is important. Where to find support  To help you and your partner with the process of grieving, talk with your health care provider or seek counseling.  Consider meeting with others who have experienced pregnancy loss. Ask your health care provider about support groups and resources. Where to find more information  [place] Department of Health and  Programmer, systems on Women's Health: [place]  American Pregnancy Association: www.americanpregnancy.org Contact a health care provider if:  You continue to experience grief, sadness, or lack of motivation for everyday activities, and those feelings do not improve over time.  You are struggling to recover emotionally, especially if you are using alcohol or substances to help. Get help right away if:  You have thoughts of hurting yourself or others. If you ever feel like you may hurt yourself or others, or have thoughts about taking your own life, get help right away. You can go to your nearest emergency department or call:  Your local emergency services (911 in the [place]  A suicide crisis helpline, such as the [place] at (814) 206-7322. This is open 24 hours a day. Summary  Any  pregnancy loss can be difficult physically and emotionally.  You may experience many different emotions while you grieve. Emotional recovery can last longer than physical recovery.  It is normal to have emotional stress after a pregnancy loss. But emotional stress that lasts a long time or becomes severe requires treatment.  See your health care provider if you are struggling emotionally after a pregnancy loss. This information is not intended to replace advice given to you by your health care provider. Make sure you discuss any questions you have with your health care provider. Document Released: 05/25/2017 Document Revised: 07/04/2018 Document Reviewed: 05/25/2017 Elsevier Patient Education  2020 [employer].

## 2018-12-12 NOTE — MAU Provider Note (Signed)
Chief Complaint: Vaginal Bleeding and Abdominal Cramping   First Provider Initiated Contact with Patient 12/12/18 1231     SUBJECTIVE HPI: Gabrielle Jordan is a 23 y.o. [id number] at [redacted]w[redacted]d who presents to Maternity Admissions reporting vaginal bleeding and abdominal pain. Has had these symptoms off & on for the last 2+ weeks. Had an ultrasound last week that showed an IUGS with yolk sac & has a f/u viability ultrasound next week.  States bleeding became heavier around 11 am this morning. Feels like she's in the middle of a period. Has not saturated pads or passed blood clots. Also reports increasing abdominal cramping.   Location: abdomen Quality: cramping Severity: 10/10 on pain scale Duration: 2 weeks Timing: intermittent Modifying factors: none Associated signs and symptoms: vaginal bleeding  Past Medical History:  Diagnosis Date  . Kidney stones    OB History  Gravida Para Term Preterm AB Living  6 4 4   1 4   SAB TAB Ectopic Multiple Live Births  1       4    # Outcome Date GA Lbr Len/2nd Weight Sex Delivery Anes PTL Lv  6 Current           5 Term 03/18/18    M Vag-Spont   LIV  4 Term 04/24/15    F Vag-Spont   LIV  3 Term 07/12/13    M Vag-Spont   LIV  2 Term 08/31/12    F Vag-Spont   LIV  1 SAB            Past Surgical History:  Procedure Laterality Date  . ANKLE SURGERY    . HEAD & NECK SKIN LESION EXCISIONAL BIOPSY     Social History   Socioeconomic History  . Marital status: Single    Spouse name: Not on file  . Number of children: Not on file  . Years of education: Not on file  . Highest education level: Not on file  Occupational History  . Not on file  Social Needs  . Financial resource strain: Not on file  . Food insecurity    Worry: Not on file    Inability: Not on file  . Transportation needs    Medical: Not on file    Non-medical: Not on file  Tobacco Use  . Smoking status: Former Smoker    Types: Cigars    Quit date: 11/25/2018    Years since  quitting: 0.0  . Smokeless tobacco: Never Used  . Tobacco comment: 2 per day   Substance and Sexual Activity  . Alcohol use: Not Currently  . Drug use: Never  . Sexual activity: Yes    Birth control/protection: None    Comment: last sex 3 sep 20  Lifestyle  . Physical activity    Days per week: Not on file    Minutes per session: Not on file  . Stress: Not on file  Relationships  . Social Musician on phone: Not on file    Gets together: Not on file    Attends religious service: Not on file    Active member of club or organization: Not on file    Attends meetings of clubs or organizations: Not on file    Relationship status: Not on file  . Intimate partner violence    Fear of current or ex partner: Not on file    Emotionally abused: Not on file    Physically abused: Not on file  Forced sexual activity: Not on file  Other Topics Concern  . Not on file  Social History Narrative  . Not on file   History reviewed. No pertinent family history. No current facility-administered medications on file prior to encounter.    Current Outpatient Medications on File Prior to Encounter  Medication Sig Dispense Refill  . Prenatal Vit-Fe Fumarate-FA (PREPLUS) 27-1 MG TABS Take 1 tablet by mouth daily. 30 tablet 13   No Known Allergies  I have reviewed patient's Past Medical Hx, Surgical Hx, Family Hx, Social Hx, medications and allergies.   Review of Systems  Constitutional: Negative.   Gastrointestinal: Positive for abdominal pain.  Genitourinary: Positive for vaginal bleeding.    OBJECTIVE Patient Vitals for the past 24 hrs:  BP Temp Temp src Pulse Resp SpO2 Weight  12/12/18 1359 120/69 - - - - - -  12/12/18 1224 115/71 98.8 F (37.1 C) Oral 81 18 - -  12/12/18 1218 - - - - - 100 % 75.3 kg   Constitutional: Well-developed, well-nourished female in no acute distress.  Cardiovascular: normal rate & rhythm, no murmur Respiratory: normal rate and effort. Lung  sounds clear throughout GI: Abd soft, non-tender, Pos BS x 4. No guarding or rebound tenderness MS: Extremities nontender, no edema, normal ROM Neurologic: Alert and oriented x 4.  GU:     SPECULUM EXAM: NEFG, small amount of dark red blood in vagina. No active bleeding. Cervix pink/smooth  BIMANUAL: No CMT. cervix closed; uterus normal size, no adnexal tenderness or masses.    LAB RESULTS Results for orders placed or performed during the hospital encounter of 12/12/18 (from the past 24 hour(s))  Urinalysis, Routine w reflex microscopic     Status: Abnormal   Collection Time: 12/12/18 12:40 PM  Result Value Ref Range   Color, Urine YELLOW YELLOW   APPearance CLOUDY (A) CLEAR   Specific Gravity, Urine 1.032 (H) 1.005 - 1.030   pH 6.0 5.0 - 8.0   Glucose, UA NEGATIVE NEGATIVE mg/dL   Hgb urine dipstick LARGE (A) NEGATIVE   Bilirubin Urine NEGATIVE NEGATIVE   Ketones, ur 5 (A) NEGATIVE mg/dL   Protein, ur 161100 (A) NEGATIVE mg/dL   Nitrite NEGATIVE NEGATIVE   Leukocytes,Ua SMALL (A) NEGATIVE   RBC / HPF 21-50 0 - 5 RBC/hpf   WBC, UA 0-5 0 - 5 WBC/hpf   Bacteria, UA RARE (A) NONE SEEN   Squamous Epithelial / LPF 0-5 0 - 5   Mucus PRESENT     IMAGING [place] Ob Transvaginal  Result Date: 12/12/2018 CLINICAL DATA:  Pregnant patient with vaginal bleeding. EXAM: TRANSVAGINAL OB ULTRASOUND TECHNIQUE: Transvaginal ultrasound was performed for complete evaluation of the gestation as well as the maternal uterus, adnexal regions, and pelvic cul-de-sac. COMPARISON:  Ob ultrasound 12/03/2018. FINDINGS: Intrauterine gestational sac: Single visualized. Yolk sac:  Visualized. Embryo:  Visualized. Cardiac Activity: Detected. Heart Rate: 89 bpm CRL:   2.94 mm   5 w 5 d                  US EDC: 08/09/2019. Subchorionic hemorrhage:  None visualized. Maternal uterus/adnexae: Appear normal. Trace amount of free pelvic fluid noted. IMPRESSION: Single living intrauterine pregnancy.  No acute finding. Electronically  Signed   By: Drusilla Kannerhomas  Dalessio M.D.   On: 12/12/2018 13:19    MAU COURSE Orders Placed This Encounter  Procedures  . US OB Transvaginal  . Urinalysis, Routine w reflex microscopic  . Discharge patient   Meds ordered  this encounter  Medications  . acetaminophen (TYLENOL) tablet 1,000 mg    MDM RH positive  Minimal bleeding on exam.  Pt given tylenol for cramping  Ultrasound shows live IUP with FHR 89 bpm Pt to keep f/u viability ultrasound d/t low HR which could possibly be normal due to early gestation.   ASSESSMENT 1. Threatened miscarriage   2. Vaginal bleeding in pregnancy, first trimester   3. Abdominal pain during pregnancy in first trimester   4. Normal IUP (intrauterine pregnancy) on prenatal ultrasound, first trimester     PLAN Discharge home in stable condition. SAB precautions  Follow-up Information    [facility] Follow up.   Specialty: Obstetrics and Gynecology Why: return for worsening symptoms Contact information: [address] [id number] [place] [phone]         Allergies as of 12/12/2018   No Known Allergies     Medication List    TAKE these medications   PrePLUS 27-1 MG Tabs Take 1 tablet by mouth daily.        Jorje Guild, NP 12/12/2018  2:17 PM

## 2018-12-12 NOTE — MAU Note (Signed)
Was seen here earlier today and was told baby had a heartbeat and everything was ok. About 1600 I went to [facility] and passed what I thought was the baby. I brought it with me. I feel like they told me everything was ok just so they could d/c me. I am very upset that I was told everything was ok and then I went home and had a miscarriage.

## 2018-12-12 NOTE — Discharge Instructions (Signed)
Return to care   If you have heavier bleeding that soaks through more that 2 pads per hour for an hour or more  If you bleed so much that you feel like you might pass out or you do pass out  If you have significant abdominal pain that is not improved with Tylenol      Threatened Miscarriage  A threatened miscarriage occurs when a woman has vaginal bleeding during the first 20 weeks of pregnancy but the pregnancy has not ended. If you have vaginal bleeding during this time, your health care provider will do tests to make sure you are still pregnant. If the tests show that you are still pregnant and that the developing baby (fetus) inside your uterus is still growing, your condition is considered a threatened miscarriage. A threatened miscarriage does not mean your pregnancy will end, but it does increase the risk of losing your pregnancy (complete miscarriage). What are the causes? The cause of this condition is usually not known. For women who go on to have a complete miscarriage, the most common cause is an abnormal number of chromosomes in the developing baby. Chromosomes are the structures inside cells that hold all of a person's genetic material. What increases the risk? The following lifestyle factors may increase your risk of a miscarriage in early pregnancy:  Smoking.  Drinking excessive amounts of alcohol or caffeine.  Recreational drug use. The following preexisting health conditions may increase your risk of a miscarriage in early pregnancy:  Polycystic ovary syndrome.  Uterine fibroids.  Infections.  Diabetes mellitus. What are the signs or symptoms? Symptoms of this condition include:  Vaginal bleeding.  Mild abdominal pain or cramps. How is this diagnosed? If you have bleeding with or without abdominal pain before 20 weeks of pregnancy, your health care provider will do tests to check whether you are still pregnant. These will include:  Ultrasound. This test  uses sound waves to create images of the inside of your uterus. This allows your health care provider to look at your developing baby and other structures, such as your placenta.  Pelvic exam. This is an internal exam of your vagina and cervix.  Measurement of your baby's heart rate.  Laboratory tests such as blood tests, urine tests, or swabs for infection You may be diagnosed with a threatened miscarriage if:  Ultrasound testing shows that you are still pregnant.  Your babys heart rate is strong.  A pelvic exam shows that the opening between your uterus and your vagina (cervix) is closed.  Blood tests confirm that you are still pregnant. How is this treated? No treatments have been shown to prevent a threatened miscarriage from going on to a complete miscarriage. However, the right home care is important. Follow these instructions at home:  Get plenty of rest.  Do not have sex or use tampons if you have vaginal bleeding.  Do not douche.  Do not smoke or use recreational drugs.  Do not drink alcohol.  Avoid caffeine.  Keep all follow-up prenatal visits as told by your health care provider. This is important. Contact a health care provider if:  You have light vaginal bleeding or spotting while pregnant.  You have abdominal pain or cramping.  You have a fever. Get help right away if:  You have heavy vaginal bleeding.  You have blood clots coming from your vagina.  You pass tissue from your vagina.  You leak fluid, or you have a gush of fluid from your vagina.  You have severe low back pain or abdominal cramps.  You have fever, chills, and severe abdominal pain. Summary  A threatened miscarriage occurs when a woman has vaginal bleeding during the first 20 weeks of pregnancy but the pregnancy has not ended.  The cause of a threatened miscarriage is usually not known.  Symptoms of this condition may include vaginal bleeding and mild abdominal pain or  cramps.  No treatments have been shown to prevent a threatened miscarriage from going on to a complete miscarriage.  Keep all follow-up prenatal visits as told by your health care provider. This is important. This information is not intended to replace advice given to you by your health care provider. Make sure you discuss any questions you have with your health care provider. Document Released: 03/14/2005 Document Revised: 04/20/2017 Document Reviewed: 06/10/2016 Elsevier Patient Education  2020 [employer]

## 2018-12-12 NOTE — MAU Provider Note (Signed)
Chief Complaint: Vaginal Bleeding   First Provider Initiated Contact with Patient 12/12/18 1947     SUBJECTIVE HPI: Gabrielle Jordan is a 23 y.o. [id number] at [redacted]w[redacted]d who presents to Maternity Admissions reporting vaginal bleeding and abdominal pain. Was seen in MAU earlier today & had an ultrasound that showed an IUP with cardiac activity. Around 4 pm, she felt increased pain. She went to the bathroom and passed a large clot that looked gray. Since then the bleeding has slowed down but the pain has continued.   Location: abdomen Quality: cramping Severity: 10/10 on pain scale Duration: 2 weeks, worse this afternoon Timing: intermittent Modifying factors: none Associated signs and symptoms: vaginal bleeding  Past Medical History:  Diagnosis Date  . Kidney stones    OB History  Gravida Para Term Preterm AB Living  6 4 4   1 4   SAB TAB Ectopic Multiple Live Births  1       4    # Outcome Date GA Lbr Len/2nd Weight Sex Delivery Anes PTL Lv  6 Current           5 Term 03/18/18    M Vag-Spont   LIV  4 Term 04/24/15    F Vag-Spont   LIV  3 Term 07/12/13    M Vag-Spont   LIV  2 Term 08/31/12    F Vag-Spont   LIV  1 SAB            Past Surgical History:  Procedure Laterality Date  . ANKLE SURGERY    . HEAD & NECK SKIN LESION EXCISIONAL BIOPSY     Social History   Socioeconomic History  . Marital status: Single    Spouse name: Not on file  . Number of children: Not on file  . Years of education: Not on file  . Highest education level: Not on file  Occupational History  . Not on file  Social Needs  . Financial resource strain: Not on file  . Food insecurity    Worry: Not on file    Inability: Not on file  . Transportation needs    Medical: Not on file    Non-medical: Not on file  Tobacco Use  . Smoking status: Former Smoker    Types: Cigars    Quit date: 11/25/2018    Years since quitting: 0.0  . Smokeless tobacco: Never Used  . Tobacco comment: 2 per day   Substance and  Sexual Activity  . Alcohol use: Not Currently  . Drug use: Never  . Sexual activity: Yes    Birth control/protection: None    Comment: last sex 3 sep 20  Lifestyle  . Physical activity    Days per week: Not on file    Minutes per session: Not on file  . Stress: Not on file  Relationships  . Social Musician on phone: Not on file    Gets together: Not on file    Attends religious service: Not on file    Active member of club or organization: Not on file    Attends meetings of clubs or organizations: Not on file    Relationship status: Not on file  . Intimate partner violence    Fear of current or ex partner: Not on file    Emotionally abused: Not on file    Physically abused: Not on file    Forced sexual activity: Not on file  Other Topics Concern  . Not on file  Social History Narrative  . Not on file   History reviewed. No pertinent family history. No current facility-administered medications on file prior to encounter.    Current Outpatient Medications on File Prior to Encounter  Medication Sig Dispense Refill  . Prenatal Vit-Fe Fumarate-FA (PREPLUS) 27-1 MG TABS Take 1 tablet by mouth daily. 30 tablet 13   No Known Allergies  I have reviewed patient's Past Medical Hx, Surgical Hx, Family Hx, Social Hx, medications and allergies.   Review of Systems  Constitutional: Negative.   Gastrointestinal: Positive for abdominal pain.  Genitourinary: Positive for vaginal bleeding.    OBJECTIVE Patient Vitals for the past 24 hrs:  BP Temp Pulse Resp Height Weight  12/12/18 1930 129/73 97.9 F (36.6 C) 81 18 4\' 8"  (1.422 m) 75.3 kg   Constitutional: Well-developed, well-nourished female in no acute distress.  Cardiovascular: normal rate & rhythm, no murmur Respiratory: normal rate and effort. Lung sounds clear throughout GI: Abd soft, non-tender, Pos BS x 4. No guarding or rebound tenderness MS: Extremities nontender, no edema, normal ROM Neurologic: Alert and  oriented x 4.      LAB RESULTS Results for orders placed or performed during the hospital encounter of 12/12/18 (from the past 24 hour(s))  Hemoglobin and hematocrit, blood     Status: None   Collection Time: 12/12/18  8:28 PM  Result Value Ref Range   Hemoglobin 12.6 12.0 - 15.0 g/dL   HCT 37.9 36.0 - 46.0 %    IMAGING US Ob Transvaginal  Result Date: 12/12/2018 CLINICAL DATA:  Continued vaginal bleeding. EXAM: TRANSVAGINAL OB ULTRASOUND TECHNIQUE: Transvaginal ultrasound was performed for complete evaluation of the gestation as well as the maternal uterus, adnexal regions, and pelvic cul-de-sac. COMPARISON:  Earlier today FINDINGS: Intrauterine gestational sac: None Yolk sac:  Not Visualized. Embryo:  Not Visualized. Cardiac Activity: Not Visualized. Subchorionic hemorrhage:  None visualized. Maternal uterus/adnexae: Right ovary: Normal Left ovary: Normal Other :None Free fluid:  Trace IMPRESSION: 1. Previously noted gestational sac containing an embryo is no longer present. Findings meet definitive criteria for failed pregnancy. This follows SRU consensus guidelines: Diagnostic Criteria for Nonviable Pregnancy Early in the First Trimester. Alison Stalling J Med [phone]. Electronically Signed   By: Kerby Moors M.D.   On: 12/12/2018 20:47   US Ob Transvaginal  Result Date: 12/12/2018 CLINICAL DATA:  Pregnant patient with vaginal bleeding. EXAM: TRANSVAGINAL OB ULTRASOUND TECHNIQUE: Transvaginal ultrasound was performed for complete evaluation of the gestation as well as the maternal uterus, adnexal regions, and pelvic cul-de-sac. COMPARISON:  Ob ultrasound 12/03/2018. FINDINGS: Intrauterine gestational sac: Single visualized. Yolk sac:  Visualized. Embryo:  Visualized. Cardiac Activity: Detected. Heart Rate: 89 bpm CRL:   2.94 mm   5 w 5 d                  [place] EDC: 08/09/2019. Subchorionic hemorrhage:  None visualized. Maternal uterus/adnexae: Appear normal. Trace amount of free pelvic fluid  noted. IMPRESSION: Single living intrauterine pregnancy.  No acute finding. Electronically Signed   By: Inge Rise M.D.   On: 12/12/2018 13:19    MAU COURSE Orders Placed This Encounter  Procedures  . US OB Transvaginal  . Hemoglobin and hematocrit, blood  . Discharge patient   Meds ordered this encounter  Medications  . ketorolac (TORADOL) injection 60 mg  . misoprostol (CYTOTEC) 200 MCG tablet    Sig: Take 4 tablets (800 mcg total) by mouth once for 1 dose.    Dispense:  4 tablet    Refill:  0    Order Specific Question:   Supervising Provider    Answer:   CONSTANT, PEGGY [[id number]]  . oxyCODONE-acetaminophen (PERCOCET) 5-325 MG tablet    Sig: Take 2 tablets by mouth every 6 (six) hours as needed for up to 3 days for severe pain.    Dispense:  12 tablet    Refill:  0    Order Specific Question:   Supervising Provider    Answer:   CONSTANT, PEGGY [[id number]]    MDM Patient seen earlier today. Ultrasound confirmed live IUP. Patient brought tissue that she passed with her, appears to be POCs, will send to pathology. Hemoglobin stable & vital signs stable Ultrasound confirms miscarriage  RH positive  Had long discussion with patient regarding miscarriage.  Continues to have pain. Will give toradol prior to discharge.   ASSESSMENT 1. Miscarriage   2. Vaginal bleeding in pregnancy, first trimester     PLAN Discharge home in stable condition. Bleeding & infection precautions reviewed F/u outpatient ultrasound cancelled Msg to [facility] for f/u SAB appt Rx cytotec & percocet  Follow-up Information    [facility] Follow up.   Specialty: Obstetrics and Gynecology Why: return for worsening symptoms Contact information: [address] [id number] [place]  [place] [id number] [phone]         Allergies as of 12/12/2018   No Known Allergies     Medication List    TAKE these medications   misoprostol 200 MCG tablet Commonly  known as: CYTOTEC Take 4 tablets (800 mcg total) by mouth once for 1 dose.   oxyCODONE-acetaminophen 5-325 MG tablet Commonly known as: Percocet Take 2 tablets by mouth every 6 (six) hours as needed for up to 3 days for severe pain.   PrePLUS 27-1 MG Tabs Take 1 tablet by mouth daily.        Judeth HornLawrence, Kaizen Ibsen, NP 12/12/2018  9:46 PM

## 2018-12-12 NOTE — MAU Note (Signed)
Pt presents to MAU with c/o vaginal bleeding. She was seen on 12/03/2018 and had +HCG, minimal spotting and a subchorionic hemorrhage. Today bleeding is heavier, bright red she also is having lower abdominal cramping.

## 2018-12-12 NOTE — Progress Notes (Signed)
Pt stated she needs to leave to be with her 53month old child at home. States she does not need any info regarding support groups after miscarriage as she has good support at home. Written and verbal d/c instructions given and understanding voiced

## 2018-12-12 NOTE — MAU Note (Signed)
Specimen pt brought from home that is ? POC sent to pathology

## 2018-12-13 ENCOUNTER — Telehealth: Payer: Self-pay | Admitting: General Practice

## 2018-12-13 NOTE — Telephone Encounter (Signed)
Left message for patient to give our office a call in regards to appointment scheduled for 12/27/2018 at 4:10pm for SAB follow up.

## 2018-12-14 LAB — SURGICAL PATHOLOGY

## 2018-12-18 ENCOUNTER — Ambulatory Visit: Payer: Medicaid Other

## 2018-12-18 ENCOUNTER — Ambulatory Visit (HOSPITAL_COMMUNITY): Payer: Medicaid Other

## 2018-12-18 ENCOUNTER — Other Ambulatory Visit: Payer: Medicaid Other | Admitting: *Deleted

## 2018-12-19 ENCOUNTER — Other Ambulatory Visit: Payer: Self-pay

## 2018-12-19 ENCOUNTER — Other Ambulatory Visit (INDEPENDENT_AMBULATORY_CARE_PROVIDER_SITE_OTHER): Payer: Medicaid Other | Admitting: *Deleted

## 2018-12-19 DIAGNOSIS — O039 Complete or unspecified spontaneous abortion without complication: Secondary | ICD-10-CM

## 2018-12-19 NOTE — Progress Notes (Signed)
     Ms. Telisa Ohlsen presents to [facility] for follow-up quant hCG blood draw today. She was seen in MAU for vaginal bleeding on 12/12/2018. Patient denies/endorses vaginal pain, having some vaginal bleeding today. Discussed with patient, we are following hCG levels today. Results will be back in approximately 1-2 days. Valid contact number for patient confirmed. I will call the patient with results.    Derl Barrow 12/19/2018 11:17 AM

## 2018-12-20 LAB — BETA HCG QUANT (REF LAB): hCG Quant: 60 m[IU]/mL

## 2018-12-27 ENCOUNTER — Ambulatory Visit: Payer: Medicaid Other | Admitting: Obstetrics and Gynecology

## 2019-01-05 ENCOUNTER — Other Ambulatory Visit: Payer: Self-pay

## 2019-01-05 ENCOUNTER — Emergency Department (HOSPITAL_COMMUNITY)
Admission: EM | Admit: 2019-01-05 | Discharge: 2019-01-05 | Disposition: A | Discharge status: Home / Self Care | Payer: Medicaid Other | Attending: Emergency Medicine | Admitting: Emergency Medicine

## 2019-01-05 ENCOUNTER — Encounter (HOSPITAL_COMMUNITY): Payer: Self-pay | Admitting: Emergency Medicine

## 2019-01-05 DIAGNOSIS — Z87891 Personal history of nicotine dependence: Secondary | ICD-10-CM | POA: Diagnosis not present

## 2019-01-05 DIAGNOSIS — K0381 Cracked tooth: Secondary | ICD-10-CM | POA: Insufficient documentation

## 2019-01-05 DIAGNOSIS — K029 Dental caries, unspecified: Secondary | ICD-10-CM | POA: Insufficient documentation

## 2019-01-05 DIAGNOSIS — K0889 Other specified disorders of teeth and supporting structures: Secondary | ICD-10-CM | POA: Diagnosis present

## 2019-01-05 DIAGNOSIS — Z79899 Other long term (current) drug therapy: Secondary | ICD-10-CM | POA: Insufficient documentation

## 2019-01-05 MED ORDER — HYDROCODONE-ACETAMINOPHEN 5-325 MG PO TABS
1.0000 | ORAL_TABLET | Freq: Once | ORAL | Status: AC
  Administered 2019-01-05: 1 via ORAL
  Filled 2019-01-05: qty 1

## 2019-01-05 MED ORDER — HYDROCODONE-ACETAMINOPHEN 5-325 MG PO TABS: 1.0000 | ORAL_TABLET | Freq: Four times a day (QID) | ORAL | 0 refills | Status: DC | PRN

## 2019-01-05 MED ORDER — AMOXICILLIN 500 MG PO CAPS: 500.0000 mg | ORAL_CAPSULE | Freq: Three times a day (TID) | ORAL | 0 refills | Status: AC

## 2019-01-05 MED ORDER — AMOXICILLIN 500 MG PO CAPS
500.0000 mg | ORAL_CAPSULE | Freq: Three times a day (TID) | ORAL | Status: DC
  Administered 2019-01-05: 500 mg via ORAL
  Filled 2019-01-05: qty 1

## 2019-01-05 NOTE — ED Triage Notes (Signed)
Pt reports a week long tooth ache and same is no longer tolerable. Pt reports she is unable to eat or sleep because of same.

## 2019-01-05 NOTE — ED Provider Notes (Signed)
[facility] EMERGENCY DEPARTMENT Provider Note   CSN: [id number] Arrival date & time: 01/05/19  0757    History   Chief Complaint Chief Complaint  Patient presents with  . Dental Pain    HPI Gabrielle Jordan is a 23 y.o. female with past medical history significant for recent miscarriage, kidney stones presents for evaluation of dental pain.  Patient states she has dental pain located to her left lower dentition.  Pain started 1 week ago.  Has history of poor dentition is not followed by dentistry.  She has been taking Tylenol without relief of symptoms.  Patient did recently have a miscarriage 1 month ago states she is not currently pregnant.  Rates her current pain a 10/10.  She denies fever, chills, nausea, vomiting, facial swelling, drooling, dysphasia, trismus, oral swelling, rash, neck pain, neck stiffness.  Denies additional aggravating relieving factors.  History obtained from patient and past medical records.  No interpreter was used.     HPI  Past Medical History:  Diagnosis Date  . Kidney stones     There are no active problems to display for this patient.   Past Surgical History:  Procedure Laterality Date  . ANKLE SURGERY    . HEAD & NECK SKIN LESION EXCISIONAL BIOPSY       OB History    Gravida  6   Para  4   Term  4   Preterm      AB  1   Living  4     SAB  1   TAB      Ectopic      Multiple      Live Births  4            Home Medications    Prior to Admission medications   Medication Sig Start Date End Date Taking? Authorizing Provider  amoxicillin (AMOXIL) 500 MG capsule Take 1 capsule (500 mg total) by mouth 3 (three) times daily for 5 days. 01/05/19 01/10/19  Barrett Holthaus A, PA-C  HYDROcodone-acetaminophen (NORCO/VICODIN) 5-325 MG tablet Take 1 tablet by mouth every 6 (six) hours as needed for severe pain. 01/05/19   Ondra Deboard A, PA-C  misoprostol (CYTOTEC) 200 MCG tablet Take 4 tablets (800 mcg  total) by mouth once for 1 dose. 12/12/18 12/12/18  Judeth Horn, NP  Prenatal Vit-Fe Fumarate-FA (PREPLUS) 27-1 MG TABS Take 1 tablet by mouth daily. 11/25/18   Calvert Cantor, CNM    Family History No family history on file.  Social History Social History   Tobacco Use  . Smoking status: Former Smoker    Types: Cigars    Quit date: 11/25/2018    Years since quitting: 0.1  . Smokeless tobacco: Never Used  . Tobacco comment: 2 per day   Substance Use Topics  . Alcohol use: Not Currently  . Drug use: Never     Allergies   Patient has no known allergies.   Review of Systems Review of Systems  Constitutional: Negative.   HENT: Positive for dental problem.   Eyes: Negative.   Respiratory: Negative.   Cardiovascular: Negative.   Gastrointestinal: Negative.   Genitourinary: Negative.   Neurological: Negative.   All other systems reviewed and are negative.    Physical Exam Updated Vital Signs BP 137/88 (BP Location: Right Arm)   Pulse 94   Temp 97.8 F (36.6 C) (Oral)   Resp 20   Ht 4\' 8"  (1.422 m)   Wt 72.6 kg  LMP 10/30/2018 (Exact Date)   SpO2 100%   Breastfeeding Unknown   BMI 35.87 kg/m   Physical Exam Vitals signs and nursing note reviewed.  Constitutional:      General: She is not in acute distress.    Appearance: She is well-developed. She is not ill-appearing or toxic-appearing.  HENT:     Head: Normocephalic and atraumatic.     Jaw: There is normal jaw occlusion.     Comments: No submandibular or facial swelling.    Mouth/Throat:     Lips: Pink. No lesions.     Mouth: Mucous membranes are moist.     Dentition: Abnormal dentition. Does not have dentures. Dental tenderness and dental caries present. No gingival swelling, dental abscesses or gum lesions.     Tongue: No lesions. Tongue does not deviate from midline.     Palate: No mass and lesions.     Pharynx: Oropharynx is clear. Uvula midline.      Comments: Posterior oropharynx clear.   Mucous membranes moist.  Patient with multiple dental caries.  Fractured tooth to her left lower dentition.  No gingival swelling.  There is tenderness palpation to left dentition where dental fracture is present.  There is no evidence of drainable periapical abscess.  No gum lesions, swelling or erythema.  No drooling, dysphasia or trismus.  Uvula midline without deviation.  Sublingual area soft. Eyes:     Pupils: Pupils are equal, round, and reactive to light.  Neck:     Musculoskeletal: Full passive range of motion without pain and normal range of motion.     Trachea: Phonation normal.     Comments: No neck stiffness or neck rigidity. Cardiovascular:     Rate and Rhythm: Normal rate.  Pulmonary:     Effort: No respiratory distress.  Abdominal:     General: There is no distension.  Musculoskeletal: Normal range of motion.  Skin:    General: Skin is warm and dry.     Comments: No rashes or lesions.  Neurological:     Mental Status: She is alert.     ED Treatments / Results  Labs (all labs ordered are listed, but only abnormal results are displayed) Labs Reviewed - No data to display  EKG None  Radiology No results found.  Procedures Procedures (including critical care time)  Medications Ordered in ED Medications  amoxicillin (AMOXIL) capsule 500 mg (500 mg Oral Given 01/05/19 0815)  HYDROcodone-acetaminophen (NORCO/VICODIN) 5-325 MG per tablet 1 tablet (1 tablet Oral Given 01/05/19 0815)     Initial Impression / Assessment and Plan / ED Course  I have reviewed the triage vital signs and the nursing notes.  Pertinent labs & imaging results that were available during my care of the patient were reviewed by me and considered in my medical decision making (see chart for details).  23 year old appears otherwise well presents for evaluation of dental pain.  Obvious dental fractures.  No evidence of drainable periapical abscess.  No drooling, dysphasia or trismus.   Sublingual area soft.  No facial swelling.  I will suspicion for deep space infection, Ludwig angina.  She did recently undergo a miscarriage and is no longer pregnant according to her medical record review from September.  Will DC home with antibiotics as well as short course of pain medicine have her follow-up with dentistry.  The patient has been appropriately medically screened and/or stabilized in the ED. I have low suspicion for any other emergent medical condition which would require  further screening, evaluation or treatment in the ED or require inpatient management.         Final Clinical Impressions(s) / ED Diagnoses   Final diagnoses:  Pain, dental    ED Discharge Orders         Ordered    amoxicillin (AMOXIL) 500 MG capsule  3 times daily     01/05/19 0812    HYDROcodone-acetaminophen (NORCO/VICODIN) 5-325 MG tablet  Every 6 hours PRN     01/05/19 0812           Marelly Wehrman A, PA-C 01/05/19 [id number]    Arby BarrettePfeiffer, Marcy, MD 01/06/19 1104

## 2019-01-05 NOTE — Discharge Instructions (Signed)
Take the medication as prescribed.  Do not drive, operate heavy machinery, drink alcohol while taking the pain medicine.  The pain medicine may become addictive.  Please follow-up with dentistry.

## 2019-05-16 ENCOUNTER — Other Ambulatory Visit: Payer: Self-pay

## 2019-05-16 ENCOUNTER — Emergency Department (HOSPITAL_COMMUNITY)
Admission: EM | Admit: 2019-05-16 | Discharge: 2019-05-17 | Disposition: A | Discharge status: Home / Self Care | Payer: Medicaid Other | Attending: Emergency Medicine | Admitting: Emergency Medicine

## 2019-05-16 DIAGNOSIS — Z87891 Personal history of nicotine dependence: Secondary | ICD-10-CM | POA: Insufficient documentation

## 2019-05-16 DIAGNOSIS — Z3A01 Less than 8 weeks gestation of pregnancy: Secondary | ICD-10-CM | POA: Insufficient documentation

## 2019-05-16 DIAGNOSIS — O23591 Infection of other part of genital tract in pregnancy, first trimester: Secondary | ICD-10-CM | POA: Insufficient documentation

## 2019-05-16 DIAGNOSIS — R102 Pelvic and perineal pain: Secondary | ICD-10-CM | POA: Diagnosis not present

## 2019-05-16 DIAGNOSIS — O99891 Other specified diseases and conditions complicating pregnancy: Secondary | ICD-10-CM | POA: Diagnosis not present

## 2019-05-16 DIAGNOSIS — B9689 Other specified bacterial agents as the cause of diseases classified elsewhere: Secondary | ICD-10-CM | POA: Insufficient documentation

## 2019-05-16 LAB — PREGNANCY, URINE: Preg Test, Ur: POSITIVE — AB

## 2019-05-16 NOTE — ED Triage Notes (Signed)
Pt presents to the ED for a pregnancy test. LMP was sometime in December, no [facility], denies any bleeding or discharge but states she is worried because she has had a miscarriage before. 4 living children, 1 miscarriage, 1 abortion.

## 2019-05-17 ENCOUNTER — Emergency Department (HOSPITAL_COMMUNITY): Payer: Medicaid Other

## 2019-05-17 LAB — CBC WITH DIFFERENTIAL/PLATELET
Abs Immature Granulocytes: 0.01 10*3/uL (ref 0.00–0.07)
Basophils Absolute: 0 10*3/uL (ref 0.0–0.1)
Basophils Relative: 0 %
Eosinophils Absolute: 0 10*3/uL (ref 0.0–0.5)
Eosinophils Relative: 1 %
HCT: 38.1 % (ref 36.0–46.0)
Hemoglobin: 12.2 g/dL (ref 12.0–15.0)
Immature Granulocytes: 0 %
Lymphocytes Relative: 31 %
Lymphs Abs: 2.1 10*3/uL (ref 0.7–4.0)
MCH: 29.8 pg (ref 26.0–34.0)
MCHC: 32 g/dL (ref 30.0–36.0)
MCV: 92.9 fL (ref 80.0–100.0)
Monocytes Absolute: 0.7 10*3/uL (ref 0.1–1.0)
Monocytes Relative: 11 %
Neutro Abs: 3.8 10*3/uL (ref 1.7–7.7)
Neutrophils Relative %: 57 %
Platelets: 187 10*3/uL (ref 150–400)
RBC: 4.1 MIL/uL (ref 3.87–5.11)
RDW: 13.8 % (ref 11.5–15.5)
WBC: 6.7 10*3/uL (ref 4.0–10.5)
nRBC: 0 % (ref 0.0–0.2)

## 2019-05-17 LAB — BASIC METABOLIC PANEL
Anion gap: 7 (ref 5–15)
BUN: 15 mg/dL (ref 6–20)
CO2: 24 mmol/L (ref 22–32)
Calcium: 9.4 mg/dL (ref 8.9–10.3)
Chloride: 106 mmol/L (ref 98–111)
Creatinine, Ser: 0.48 mg/dL (ref 0.44–1.00)
GFR calc Af Amer: >60 mL/min (ref >60)
GFR calc non Af Amer: >60 mL/min (ref >60)
Glucose, Bld: 92 mg/dL (ref 70–99)
Potassium: 4 mmol/L (ref 3.5–5.1)
Sodium: 137 mmol/L (ref 135–145)

## 2019-05-17 LAB — WET PREP, GENITAL
Sperm: NONE SEEN
Trich, Wet Prep: NONE SEEN
WBC, Wet Prep HPF POC: NONE SEEN
Yeast Wet Prep HPF POC: NONE SEEN

## 2019-05-17 LAB — ABO/RH: ABO/RH(D): B POS

## 2019-05-17 LAB — HCG, QUANTITATIVE, PREGNANCY: hCG, Beta Chain, Quant, S: 141718 m[IU]/mL — ABNORMAL HIGH (ref <5)

## 2019-05-17 MED ORDER — METRONIDAZOLE 500 MG PO TABS: 500.0000 mg | ORAL_TABLET | Freq: Two times a day (BID) | ORAL | 0 refills | Status: AC

## 2019-05-17 NOTE — ED Notes (Signed)
Patient stated she wanted her IV removed due to it being bothersome. Pt states she wanted it out of her arm and would be stuck again for any medications or blood draws. Iv removed. IV removal site clean dry and intact. Will continue to monitor.
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
## 2019-05-17 NOTE — ED Notes (Signed)
Patient was verbalized discharge instructions. Pt had no further questions at this time. NAd.

## 2019-05-18 LAB — GC/CHLAMYDIA PROBE AMP (~~LOC~~) NOT AT ARMC
Chlamydia: NEGATIVE
Neisseria Gonorrhea: NEGATIVE

## 2019-11-14 ENCOUNTER — Encounter (HOSPITAL_COMMUNITY): Payer: Self-pay | Admitting: Emergency Medicine

## 2019-11-14 ENCOUNTER — Other Ambulatory Visit: Payer: Self-pay

## 2019-11-14 ENCOUNTER — Emergency Department (HOSPITAL_COMMUNITY)
Admission: EM | Admit: 2019-11-14 | Discharge: 2019-11-15 | Disposition: A | Discharge status: Home / Self Care | Payer: Medicaid Other | Attending: Emergency Medicine | Admitting: Emergency Medicine

## 2019-11-14 DIAGNOSIS — Z202 Contact with and (suspected) exposure to infections with a predominantly sexual mode of transmission: Secondary | ICD-10-CM | POA: Diagnosis present

## 2019-11-14 DIAGNOSIS — Z5321 Procedure and treatment not carried out due to patient leaving prior to being seen by health care provider: Secondary | ICD-10-CM | POA: Insufficient documentation

## 2019-11-14 LAB — URINALYSIS, ROUTINE W REFLEX MICROSCOPIC
Bacteria, UA: NONE SEEN
Bilirubin Urine: NEGATIVE
Glucose, UA: NEGATIVE mg/dL
Hgb urine dipstick: NEGATIVE
Ketones, ur: NEGATIVE mg/dL
Nitrite: NEGATIVE
Protein, ur: NEGATIVE mg/dL
Specific Gravity, Urine: 1.029 (ref 1.005–1.030)
pH: 5 (ref 5.0–8.0)

## 2019-11-14 NOTE — ED Notes (Signed)
Patient refused temperature check. Prefers probe to forehead.
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
## 2019-11-15 NOTE — ED Notes (Signed)
Patient seen leaving 

## 2019-11-25 ENCOUNTER — Encounter (HOSPITAL_COMMUNITY): Payer: Self-pay | Admitting: Emergency Medicine

## 2019-11-25 ENCOUNTER — Emergency Department (HOSPITAL_COMMUNITY)
Admission: EM | Admit: 2019-11-25 | Discharge: 2019-11-25 | Disposition: A | Discharge status: Home / Self Care | Payer: Medicaid Other | Attending: Emergency Medicine | Admitting: Emergency Medicine

## 2019-11-25 ENCOUNTER — Other Ambulatory Visit: Payer: Self-pay

## 2019-11-25 DIAGNOSIS — Z87891 Personal history of nicotine dependence: Secondary | ICD-10-CM | POA: Insufficient documentation

## 2019-11-25 DIAGNOSIS — Z202 Contact with and (suspected) exposure to infections with a predominantly sexual mode of transmission: Secondary | ICD-10-CM | POA: Insufficient documentation

## 2019-11-25 MED ORDER — STERILE WATER FOR INJECTION IJ SOLN
INTRAMUSCULAR | Status: AC
  Filled 2019-11-25: qty 10

## 2019-11-25 MED ORDER — CEFTRIAXONE SODIUM 500 MG IJ SOLR
500.0000 mg | Freq: Once | INTRAMUSCULAR | Status: AC
  Administered 2019-11-25: 500 mg via INTRAMUSCULAR
  Filled 2019-11-25: qty 500
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
## 2019-11-25 NOTE — Discharge Instructions (Signed)
You were seen in the emergency department today due to an STD exposure.  You were given a shot of Rocephin, an antibiotic, this will cover for gonorrhea, as this patient will need to take the previously prescribed azithromycin to cover for possible chlamydia.  Please take this on a full stomach.  Follow-up with the health department in 1 week.  Return to the ER for new or worsening symptoms including but limited to vaginal discharge, pelvic pain, fever, vomiting, or any other concerns.

## 2019-11-25 NOTE — ED Provider Notes (Signed)
[facility] EMERGENCY DEPARTMENT Provider Note   CSN: [id number] Arrival date & time: 11/25/19  1656     History Chief Complaint  Patient presents with  . Exposure to STD    Gabrielle Jordan is a 24 y.o. female who presents to the emergency department requesting a shot to treat possible STD. Patient states her husband was diagnosed with chlamydia, she went to the health dept to get tested last week- has not heard results, but was given doxycyline BID which she did not tolerate well. She was also given Azithromycin to take as an alternative however she was nervous to take it as the doxycycline had made her lightheaded last week.  She is requesting a shot to treat her possible STI.  She currently feels asymptomatic.  She denies fever, chills, dysuria, vaginal discharge, or vaginal bleeding.  She just wants to ensure that she gets fully treated.  HPI     Past Medical History:  Diagnosis Date  . Kidney stones     There are no problems to display for this patient.   Past Surgical History:  Procedure Laterality Date  . ANKLE SURGERY    . HEAD & NECK SKIN LESION EXCISIONAL BIOPSY       OB History    Gravida  6   Para  4   Term  4   Preterm      AB  1   Living  4     SAB  1   TAB      Ectopic      Multiple      Live Births  4           No family history on file.  Social History   Tobacco Use  . Smoking status: Former Smoker    Types: Cigars    Quit date: 11/25/2018    Years since quitting: 1.0  . Smokeless tobacco: Never Used  . Tobacco comment: 2 per day   Vaping Use  . Vaping Use: Never used  Substance Use Topics  . Alcohol use: Not Currently  . Drug use: Never    Home Medications Prior to Admission medications   Medication Sig Start Date End Date Taking? Authorizing Provider  HYDROcodone-acetaminophen (NORCO/VICODIN) 5-325 MG tablet Take 1 tablet by mouth every 6 (six) hours as needed for severe pain. Patient not taking:  Reported on 05/17/2019 01/05/19   Henderly, Britni A, PA-C  misoprostol (CYTOTEC) 200 MCG tablet Take 4 tablets (800 mcg total) by mouth once for 1 dose. Patient not taking: Reported on 05/17/2019 12/12/18 12/12/18  Judeth Horn, NP  Prenatal Vit-Fe Fumarate-FA (PREPLUS) 27-1 MG TABS Take 1 tablet by mouth daily. Patient not taking: Reported on 05/17/2019 11/25/18   Calvert Cantor, CNM    Allergies    Patient has no known allergies.  Review of Systems   Review of Systems  Constitutional: Negative for chills and fever.  Respiratory: Negative for shortness of breath.   Cardiovascular: Negative for chest pain.  Gastrointestinal: Negative for abdominal pain and vomiting.  Genitourinary: Negative for dysuria, vaginal bleeding and vaginal discharge.  Neurological: Negative for syncope.    Physical Exam Updated Vital Signs BP 115/66 (BP Location: Right Arm)   Pulse 86   Temp 99.2 F (37.3 C) (Oral)   Resp 16   Ht 4\' 8"  (1.422 m)   Wt 74 kg   LMP 10/31/2019   SpO2 100%   BMI 36.58 kg/m   Physical Exam Vitals  and nursing note reviewed.  Constitutional:      General: She is not in acute distress.    Appearance: She is well-developed.  HENT:     Head: Normocephalic and atraumatic.  Eyes:     General:        Right eye: No discharge.        Left eye: No discharge.     Conjunctiva/sclera: Conjunctivae normal.  Neurological:     Mental Status: She is alert.     Comments: Clear speech.   Psychiatric:        Behavior: Behavior normal.        Thought Content: Thought content normal.     ED Results / Procedures / Treatments   Labs (all labs ordered are listed, but only abnormal results are displayed) Labs Reviewed - No data to display  EKG None  Radiology No results found.  Procedures Procedures (including critical care time)  Medications Ordered in ED Medications  cefTRIAXone (ROCEPHIN) injection 500 mg (has no administration in time range)    ED Course  I  have reviewed the triage vital signs and the nursing notes.  Pertinent labs & imaging results that were available during my care of the patient were reviewed by me and considered in my medical decision making (see chart for details).    MDM Rules/Calculators/A&P                          Patient presents to the emergency department requesting a shot for STI treatment.  Recent exposure to chlamydia.  Did have testing at the health department does not know her results yet.  She is currently asymptomatic.  We will give her IM Rocephin to cover for gonorrhea.  We discussed that this is not covered for chlamydia, I explained to her that she would need to take the azithromycin she was previously prescribed, offered for her to receive this in the ED with observation and symptomatic management which she declined and would prefer to take this at home, encouraged her to take this with a meal that she has a full stomach.  Discussed health department follow-up. I discussed treatment plan, need for follow-up, and return precautions with the patient. Provided opportunity for questions, patient confirmed understanding and is in agreement with plan.   Final Clinical Impression(s) / ED Diagnoses Final diagnoses:  STD exposure    Rx / DC Orders ED Discharge Orders    None       Cherly Anderson, PA-C 11/25/19 1747    Eber Hong, MD 11/30/19 1431

## 2020-05-14 IMAGING — US US OB TRANSVAGINAL
1 series · 14 of 28 positions shown · non-contrast
Comparison: None.

CLINICAL DATA: Pelvic pain

EXAM:
OBSTETRIC <14 WK US AND TRANSVAGINAL OB US
TECHNIQUE: Both transabdominal and transvaginal ultrasound examinations were
performed for complete evaluation of the gestation as well as the
maternal uterus, adnexal regions, and pelvic cul-de-sac.
Transvaginal technique was performed to assess early pregnancy.

[Series 1: us ob transvaginal · 14 of 89 slices shown]
[im 4/89]
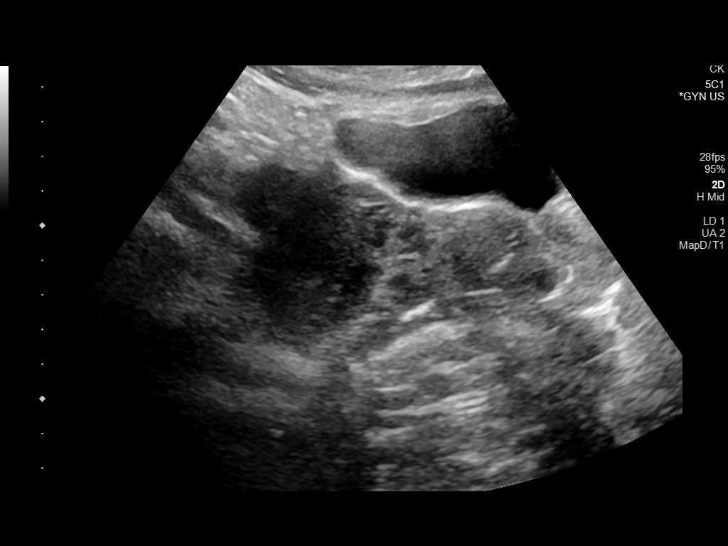
[im 10/89]
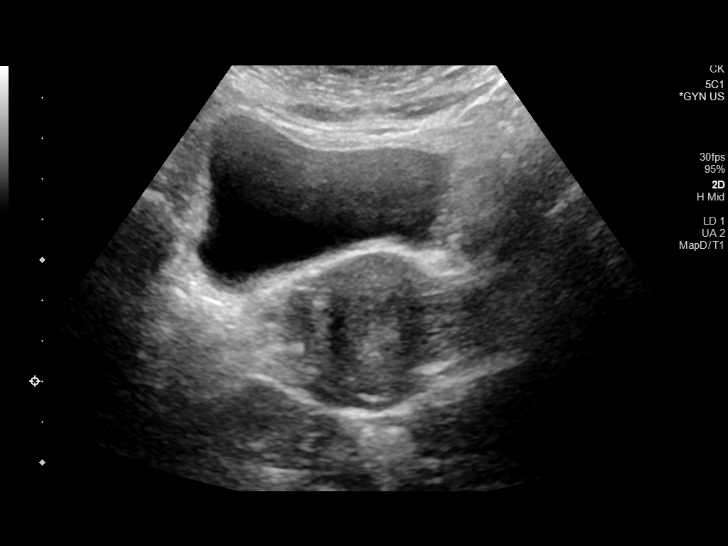
[im 17/89]
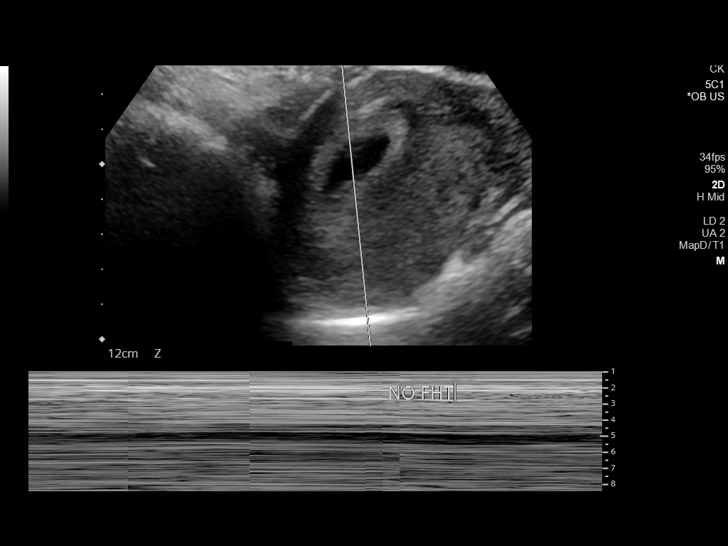
[im 23/89]
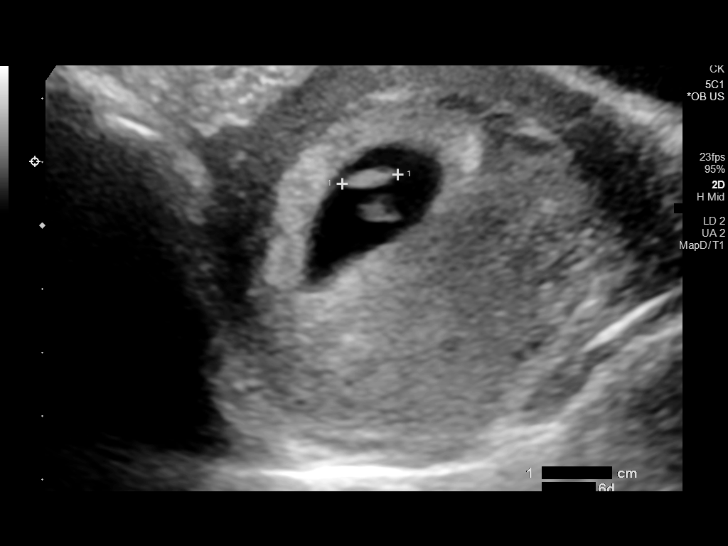
[im 30/89]
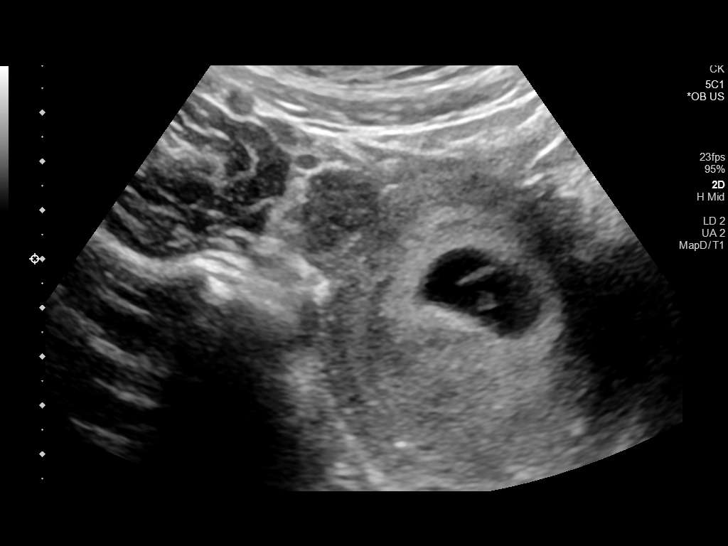
[im 36/89]
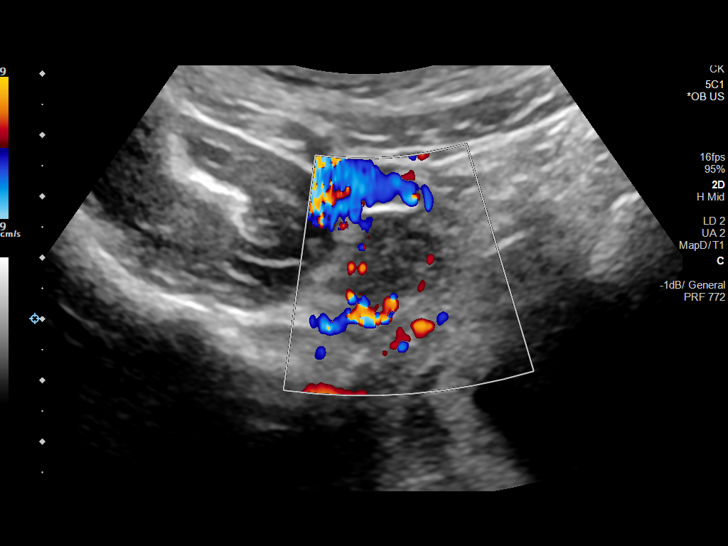
[im 43/89]
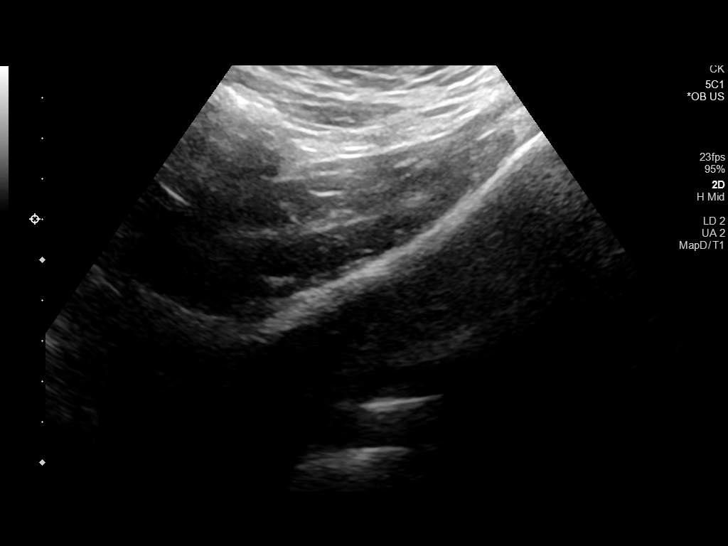
[im 49/89]
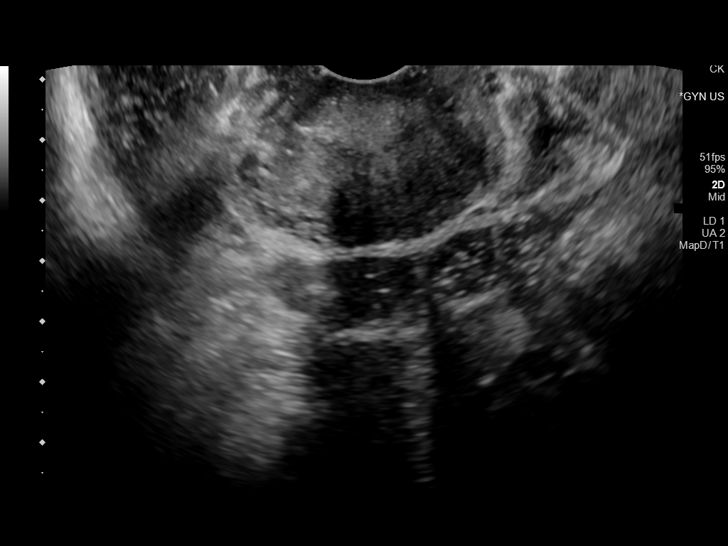
[im 56/89]
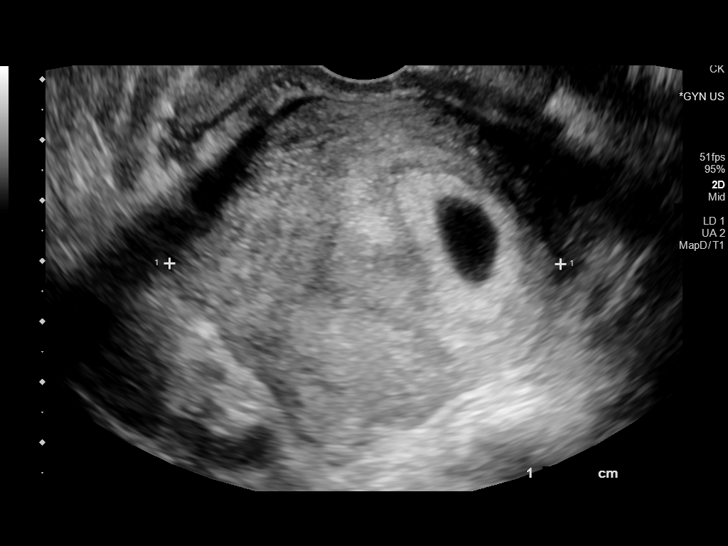
[im 62/89]
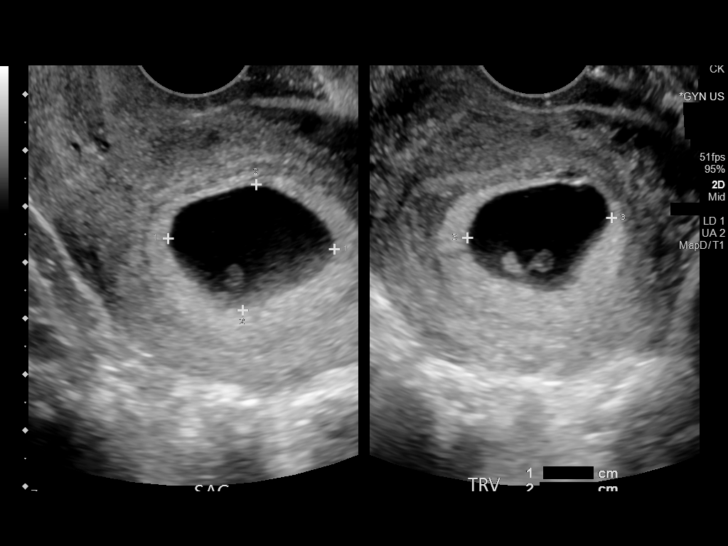
[im 69/89]
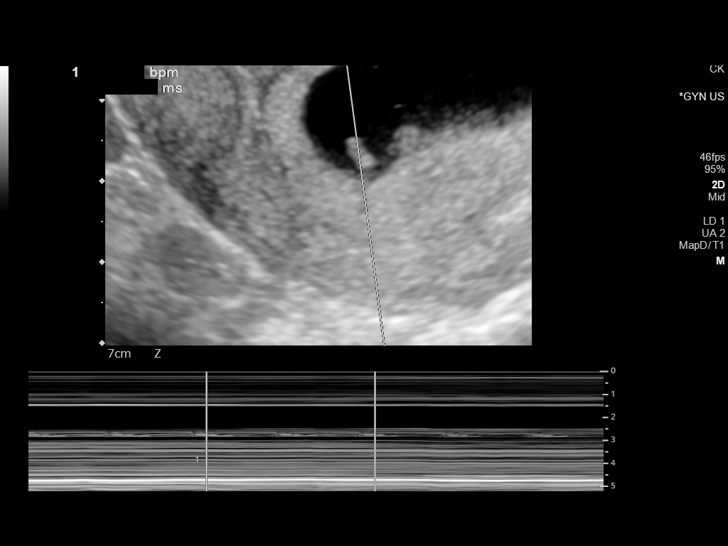
[im 75/89]
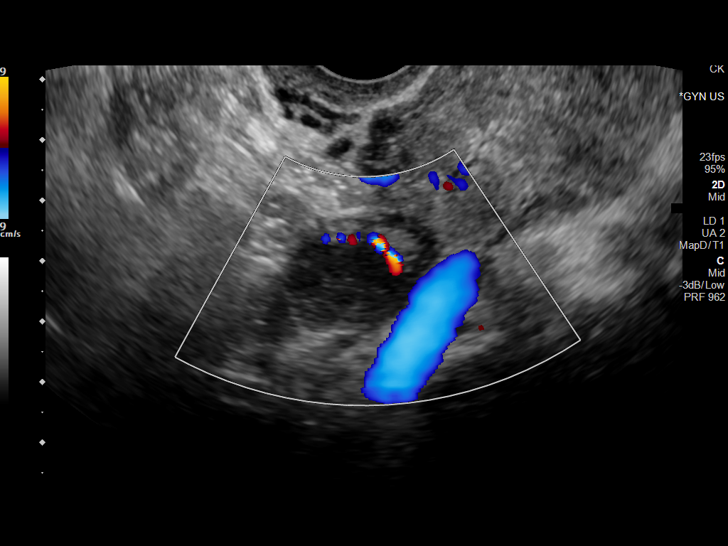
[im 82/89]
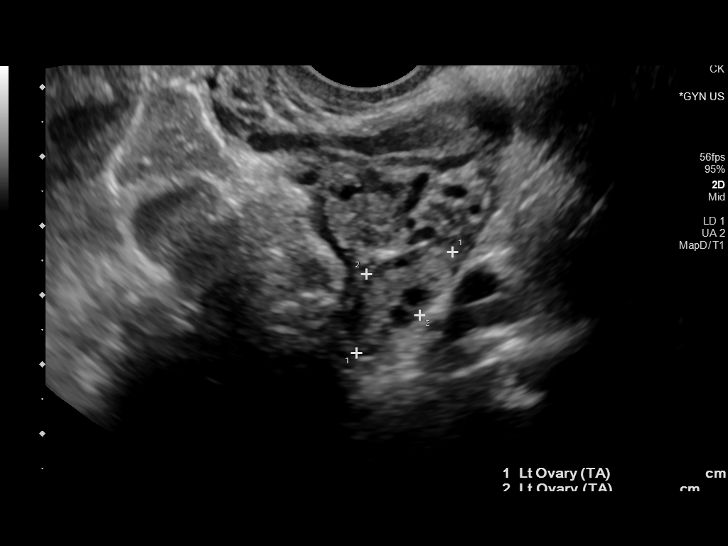
[im 89/89]
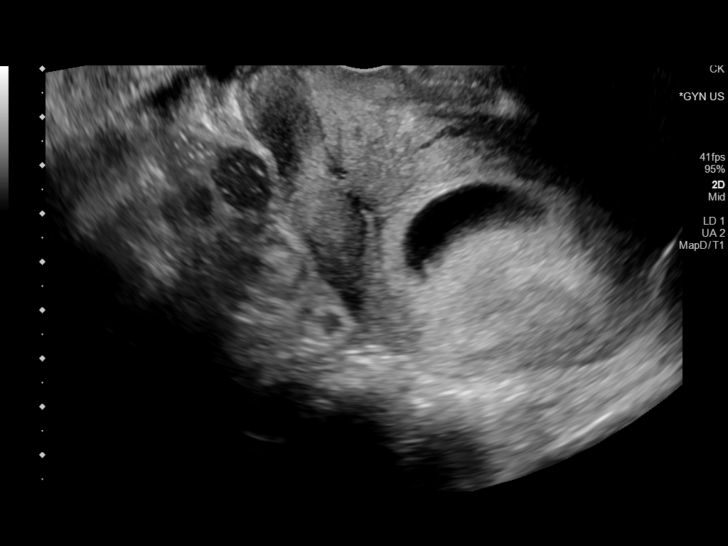

[14 of 28 positions shown; findings below may reference images not displayed]

FINDINGS: Intrauterine gestational sac: Single

Yolk sac:  Visualized.

Embryo:  Visualized.

Cardiac Activity: Visualized.

Heart Rate: 121 bpm

CRL: 4.5 mm   6 w   1 d                  US EDC: 01/09/2020

Subchorionic hemorrhage:  None visualized.

Maternal uterus/adnexae: There is a trace amount of pelvic free
fluid. There is no significant maternal abnormality.
IMPRESSION: Single live IUP at 6 weeks and 1 day. No significant maternal
abnormality.

## 2020-06-17 ENCOUNTER — Emergency Department (HOSPITAL_COMMUNITY)
Admission: EM | Admit: 2020-06-17 | Discharge: 2020-06-18 | Disposition: A | Discharge status: Home / Self Care | Payer: Medicaid Other | Attending: Emergency Medicine | Admitting: Emergency Medicine

## 2020-06-17 ENCOUNTER — Emergency Department (HOSPITAL_COMMUNITY): Payer: Medicaid Other

## 2020-06-17 ENCOUNTER — Other Ambulatory Visit: Payer: Self-pay

## 2020-06-17 DIAGNOSIS — S99912A Unspecified injury of left ankle, initial encounter: Secondary | ICD-10-CM | POA: Diagnosis present

## 2020-06-17 DIAGNOSIS — S93492A Sprain of other ligament of left ankle, initial encounter: Secondary | ICD-10-CM | POA: Insufficient documentation

## 2020-06-17 DIAGNOSIS — S93402A Sprain of unspecified ligament of left ankle, initial encounter: Secondary | ICD-10-CM

## 2020-06-17 DIAGNOSIS — Z87891 Personal history of nicotine dependence: Secondary | ICD-10-CM | POA: Diagnosis not present

## 2020-06-17 MED ORDER — IBUPROFEN 400 MG PO TABS
400.0000 mg | ORAL_TABLET | Freq: Once | ORAL | Status: DC | PRN
  Filled 2020-06-17: qty 1
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
## 2020-06-18 NOTE — Progress Notes (Signed)
Orthopedic Tech Progress Note Patient Details:  Gabrielle Jordan Jun 14, 1995 [id number]  Ortho Devices Type of Ortho Device: ASO,Crutches Ortho Device/Splint Location: Left Foot Ortho Device/Splint Interventions: Application   Post Interventions Patient Tolerated: Well Instructions Provided: Adjustment of device   [name] E Lynsay Fesperman 06/18/2020, 12:56 AM

## 2020-06-18 NOTE — Discharge Instructions (Addendum)
Apply ice for 30 minutes at a time, 4 times a day.  Use the ankle splint orthotic and crutches as needed.  Take ibuprofen or naproxen as needed for pain.  If you need additional pain relief, add acetaminophen.
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
## 2021-11-16 ENCOUNTER — Encounter: Payer: Medicaid Other | Admitting: Advanced Practice Midwife

## 2023-02-23 ENCOUNTER — Encounter (HOSPITAL_COMMUNITY): Payer: Self-pay

## 2023-02-23 ENCOUNTER — Other Ambulatory Visit: Payer: Self-pay

## 2023-02-23 ENCOUNTER — Inpatient Hospital Stay (HOSPITAL_COMMUNITY)
Admission: AD | Admit: 2023-02-23 | Discharge: 2023-02-23 | Disposition: A | Discharge status: Home / Self Care | Payer: Medicaid Other | Attending: Obstetrics & Gynecology | Admitting: Obstetrics & Gynecology

## 2023-02-23 DIAGNOSIS — O26892 Other specified pregnancy related conditions, second trimester: Secondary | ICD-10-CM

## 2023-02-23 DIAGNOSIS — R300 Dysuria: Secondary | ICD-10-CM | POA: Insufficient documentation

## 2023-02-23 DIAGNOSIS — Z3A13 13 weeks gestation of pregnancy: Secondary | ICD-10-CM

## 2023-02-23 DIAGNOSIS — O09899 Supervision of other high risk pregnancies, unspecified trimester: Secondary | ICD-10-CM

## 2023-02-23 HISTORY — DX: Anemia, unspecified: D64.9

## 2023-02-23 HISTORY — DX: Depression, unspecified: F32.A

## 2023-02-23 HISTORY — DX: Urinary tract infection, site not specified: N39.0

## 2023-02-23 LAB — URINALYSIS, ROUTINE W REFLEX MICROSCOPIC
Bilirubin Urine: NEGATIVE
Glucose, UA: NEGATIVE mg/dL
Ketones, ur: 5 mg/dL — AB
Nitrite: NEGATIVE
Protein, ur: 30 mg/dL — AB
Specific Gravity, Urine: 1.027 (ref 1.005–1.030)
WBC, UA: >50 WBC/hpf (ref 0–5)
pH: 5 (ref 5.0–8.0)

## 2023-02-23 MED ORDER — CEFADROXIL 500 MG PO CAPS: 500.0000 mg | ORAL_CAPSULE | Freq: Two times a day (BID) | ORAL | 0 refills | Status: AC

## 2023-02-23 MED ORDER — PHENAZOPYRIDINE HCL 200 MG PO TABS: 200.0000 mg | ORAL_TABLET | Freq: Three times a day (TID) | ORAL | 0 refills | Status: AC

## 2023-02-23 MED ORDER — PROCHLORPERAZINE MALEATE 10 MG PO TABS: 10.0000 mg | ORAL_TABLET | Freq: Two times a day (BID) | ORAL | 0 refills | Status: AC | PRN

## 2023-02-23 NOTE — MAU Provider Note (Signed)
History     CSN: [id number]  Arrival date and time: 02/23/23 1208   Event Date/Time   First Provider Initiated Contact with Patient 02/23/23 1338      Chief Complaint  Patient presents with   Dysuria   HPI [name] is a 27 y.o. year old [phone] female at [redacted]w[redacted]d weeks gestation who presents to MAU reporting dysuria, "pees every 5 seconds." She reports she has throbbing and pressure with urination. She has a h/o kidney stones. She thought it was that, but it has been getting worse. She denies fever or chills. She states, "I've just moved here 2 weeks ago, I already have 5 kids, feeling overwhelmed, this pregnancy is not planned and she doesn't want this baby, and she is considering putting this baby up for adoption or giving it to the FOB.    OB History     Gravida  7   Para  5   Term  4   Preterm  1   AB  1   Living  5      SAB  1   IAB      Ectopic      Multiple      Live Births  5           Past Medical History:  Diagnosis Date   Anemia    Depression    Kidney stones    UTI (urinary tract infection)     Past Surgical History:  Procedure Laterality Date   ANKLE SURGERY     HEAD & NECK SKIN LESION EXCISIONAL BIOPSY      Family History  Problem Relation Age of Onset   Diabetes Mother    Hypertension Father     Social History   Tobacco Use   Smoking status: Former    Types: Cigars    Quit date: 11/25/2018    Years since quitting: 4.2   Smokeless tobacco: Never   Tobacco comments:    2 per day   Vaping Use   Vaping status: Never Used  Substance Use Topics   Alcohol use: Not Currently   Drug use: Never    Allergies: No Known Allergies  No medications prior to admission.    Review of Systems  Constitutional: Negative.   HENT: Negative.    Eyes: Negative.   Respiratory: Negative.    Cardiovascular: Negative.   Gastrointestinal: Negative.   Endocrine: Negative.   Genitourinary:  Positive for dysuria, frequency and  urgency.  Musculoskeletal: Negative.   Skin: Negative.   Allergic/Immunologic: Negative.   Neurological: Negative.   Hematological: Negative.   Psychiatric/Behavioral: Negative.     Physical Exam   Blood pressure 118/87, pulse 91, temperature 98.3 F (36.8 C), temperature source Oral, resp. rate 20, height 4\' 8"  (1.422 m), weight 70.8 kg, last menstrual period 11/13/2022, SpO2 100%, unknown if currently breastfeeding.  Physical Exam Vitals and nursing note reviewed.  Constitutional:      Appearance: Normal appearance. She is obese.  Cardiovascular:     Rate and Rhythm: Normal rate.  Pulmonary:     Effort: Pulmonary effort is normal.  Musculoskeletal:        General: Normal range of motion.  Skin:    General: Skin is warm and dry.  Neurological:     Mental Status: She is alert and oriented to person, place, and time.  Psychiatric:        Mood and Affect: Mood normal.  Behavior: Behavior normal.        Thought Content: Thought content normal.        Judgment: Judgment normal.     MAU Course  Procedures  MDM CCUA UCx -- not ordered in time after d/c; abx treatment started prior to d/c   Results for orders placed or performed during the hospital encounter of 02/23/23 (from the past 24 hour(s))  Urinalysis, Routine w reflex microscopic -Urine, Clean Catch     Status: Abnormal   Collection Time: 02/23/23  1:08 PM  Result Value Ref Range   Color, Urine AMBER (A) YELLOW   APPearance HAZY (A) CLEAR   Specific Gravity, Urine 1.027 1.005 - 1.030   pH 5.0 5.0 - 8.0   Glucose, UA NEGATIVE NEGATIVE mg/dL   Hgb urine dipstick SMALL (A) NEGATIVE   Bilirubin Urine NEGATIVE NEGATIVE   Ketones, ur 5 (A) NEGATIVE mg/dL   Protein, ur 30 (A) NEGATIVE mg/dL   Nitrite NEGATIVE NEGATIVE   Leukocytes,Ua MODERATE (A) NEGATIVE   RBC / HPF 11-20 0 - 5 RBC/hpf   WBC, UA >50 0 - 5 WBC/hpf   Bacteria, UA RARE (A) NONE SEEN   Squamous Epithelial / HPF 0-5 0 - 5 /HPF   Mucus PRESENT     Non Squamous Epithelial 0-5 (A) NONE SEEN    Assessment and Plan  1. Dysuria during pregnancy in second trimester - Prescription for: Cefadroxil 500 mg po BID - Prescription for: Pyridium 200 mg po TID x 3 days - Advised Pyridium will turn urine dark orange/red  2. [redacted] weeks gestation of pregnancy - Prescription for: Compazine 10 mg po BID - Information provided on [facility] [facility] - Advised to started [facility] ASAP  - Discharge patient - Patient verbalized an understanding of the plan of care and agrees.   Raelyn Mora, CNM 02/23/2023, 1:40 PM

## 2023-02-23 NOTE — Discharge Instructions (Signed)
[facility] [employer] [facility] [employer] at [employer] [facility]             [address] [place], [place] [address] [phone]  Center for [employer] at [facility]                                                             [address], [address], [place], [place], [address] [phone]  Center for [facility] at [facility] [address], [address], [place], [place], [address] [phone]  Center for [facility] at [facility] [address], [place], [place], [address] [phone]  Center for [facility] at [facility]                                 [address] [place], [place], [place], [address] [phone]  Center for [facility] at [facility]                                    [address], [place], [place], [address] [phone]  Center for [facility] at [facility] [address], [place], [place], [address]                              [facility] [place] [address], [address], [place], [place], [address] [phone]  [facility]         Phone: [phone]  [facility]      Phone: [phone]   [facility]      Phone: [phone]  [facility]         Phone: [phone]   [facility]-M[facility]    Phone: [phone]  [facility]      Phone: [phone]  [facility]     Phone: [phone]  [facility]        Phone: [phone]  [facility]) [phone]  [facility]      Phone: [phone]                        Safe Medications in Pregnancy    Acne: Benzoyl Peroxide Salicylic Acid  Backache/Headache: Tylenol: 2 regular strength every 4 hours OR               2 Extra strength every 6 hours  Colds/Coughs/Allergies: Benadryl (alcohol free) 25 mg every 6 hours as needed Breath right strips Claritin Cepacol throat lozenges Chloraseptic throat spray Cold-Eeze- up to three times per day Cough drops, alcohol free Flonase (by prescription only) Guaifenesin Mucinex Robitussin DM (plain only, alcohol free) Saline nasal spray/drops Sudafed (pseudoephedrine) & Actifed ** use only after [redacted] weeks gestation and if you do not have high blood pressure Tylenol Vicks Vaporub  Zinc lozenges Zyrtec   Constipation: Colace Ducolax suppositories Fleet enema Glycerin suppositories Metamucil Milk of magnesia Miralax Senokot Smooth move tea  Diarrhea: Kaopectate Imodium A-D  *NO pepto Bismol  Hemorrhoids: Anusol Anusol HC Preparation H Tucks  Indigestion: Tums Maalox Mylanta Zantac  Pepcid  Insomnia: Benadryl (alcohol free) 25mg  every 6 hours as needed Tylenol PM Unisom, no Gelcaps  Leg Cramps: Tums MagGel  Nausea/Vomiting:  Bonine Dramamine Emetrol Ginger extract Sea bands Meclizine  Nausea medication to take during pregnancy:  Unisom (doxylamine succinate 25 mg tablets) Take one tablet daily at bedtime. If symptoms are not adequately controlled, the dose can be increased to a maximum recommended dose of two tablets daily (1/2 tablet in the morning, 1/2 tablet mid-afternoon and one at bedtime). Vitamin B6 100mg  tablets. Take one tablet twice a day (up to 200 mg per day).  Skin Rashes: Aveeno products Benadryl cream or 25mg  every 6 hours as needed Calamine Lotion 1% cortisone cream  Yeast infection: Gyne-lotrimin 7 Monistat 7   **If taking multiple medications, please check labels to avoid duplicating the same active ingredients **take medication as directed on the label ** Do not exceed 4000 mg of tylenol in 24 hours **Do not take medications that contain aspirin or ibuprofen

## 2023-02-23 NOTE — ED Provider Triage Note (Signed)
Emergency Medicine Provider OB Triage Evaluation Note  Berra Widdison is a 27 y.o. female, [id number], at [redacted]w[redacted]d gestation who presents to the emergency department with complaints of dysuria.  Review of  Systems  Positive: Dysuria, abdominal pain Negative: Vaginal bleeding, loss of vaginal fluid, vaginal discharge, fevers, chills  Physical Exam  BP 118/87 (BP Location: Right Arm)   Pulse 91   Temp 98.3 F (36.8 C) (Oral)   Resp 20   Ht 4\' 8"  (1.422 m)   Wt 70.8 kg   LMP 11/13/2022   SpO2 100%   BMI 34.97 kg/m  General: Awake, no distress  HEENT: Atraumatic  Resp: Normal effort  Cardiac: Normal rate Abd: Nondistended, nontender  MSK: Moves all extremities without difficulty Neuro: Speech clear  Medical Decision Making  Pt evaluated for pregnancy concern and is stable for transfer to MAU. Pt is in agreement with plan for transfer.  12:51 PM Discussed with MAU, Jolynn Spurlock-Frizzell, who accepts patient in transfer.  Clinical Impression   1. Dysuria during pregnancy in second trimester   2. [redacted] weeks gestation of pregnancy        Rolla Flatten, MD 02/23/23 1718

## 2023-02-23 NOTE — MAU Note (Signed)
Gabrielle Jordan is a 27 y.o. at 102w3d, here in MAU reporting: sent up from ER, c/o pain with urination, pees every 5 seconds- throbs and pressure. Hurts to walk. Denies fever or chills. Hx of kidney stones, thought it was maybe that, but is getting worse. Has had an [place] on 10/21 was 8 wks preg Onset of complaint:  Pain score: 10 Vitals:   02/23/23 1215 02/23/23 1257  BP: 110/77 118/87  Pulse: 88 91  Resp: 20   Temp: 98.3 F (36.8 C)   SpO2: 99% 100%      Lab orders placed from triage:  urine    Tearful in triage, circumstances, not the pain

## 2023-02-23 NOTE — ED Triage Notes (Addendum)
Pt c.o dysuria and urinary frequency x2 days. Pt states she is pregnant
